# Patient Record
Sex: Female | Born: 1955
Health system: Southern US, Community
[De-identification: ages and names within clinical notes are randomized; demographics above are authoritative.]

## PROBLEM LIST (undated history)

## (undated) DIAGNOSIS — I10 Essential (primary) hypertension: Secondary | ICD-10-CM

## (undated) DIAGNOSIS — E7800 Pure hypercholesterolemia, unspecified: Secondary | ICD-10-CM

## (undated) DIAGNOSIS — E119 Type 2 diabetes mellitus without complications: Secondary | ICD-10-CM

---

## 2018-12-15 DIAGNOSIS — I10 Essential (primary) hypertension: Secondary | ICD-10-CM | POA: Diagnosis not present

## 2018-12-15 DIAGNOSIS — E119 Type 2 diabetes mellitus without complications: Secondary | ICD-10-CM | POA: Diagnosis not present

## 2019-03-26 DIAGNOSIS — E669 Obesity, unspecified: Secondary | ICD-10-CM | POA: Diagnosis not present

## 2019-03-26 DIAGNOSIS — I10 Essential (primary) hypertension: Secondary | ICD-10-CM | POA: Diagnosis not present

## 2019-03-26 DIAGNOSIS — E785 Hyperlipidemia, unspecified: Secondary | ICD-10-CM | POA: Diagnosis not present

## 2019-03-26 DIAGNOSIS — E119 Type 2 diabetes mellitus without complications: Secondary | ICD-10-CM | POA: Diagnosis not present

## 2019-03-31 ENCOUNTER — Encounter (INDEPENDENT_AMBULATORY_CARE_PROVIDER_SITE_OTHER): Payer: Self-pay | Admitting: *Deleted

## 2019-05-20 DIAGNOSIS — Z1231 Encounter for screening mammogram for malignant neoplasm of breast: Secondary | ICD-10-CM | POA: Diagnosis not present

## 2019-06-25 DIAGNOSIS — I10 Essential (primary) hypertension: Secondary | ICD-10-CM | POA: Diagnosis not present

## 2019-06-25 DIAGNOSIS — E119 Type 2 diabetes mellitus without complications: Secondary | ICD-10-CM | POA: Diagnosis not present

## 2019-06-25 DIAGNOSIS — E785 Hyperlipidemia, unspecified: Secondary | ICD-10-CM | POA: Diagnosis not present

## 2019-06-25 DIAGNOSIS — E669 Obesity, unspecified: Secondary | ICD-10-CM | POA: Diagnosis not present

## 2019-07-07 DIAGNOSIS — Z1211 Encounter for screening for malignant neoplasm of colon: Secondary | ICD-10-CM | POA: Diagnosis not present

## 2019-07-07 DIAGNOSIS — Z6836 Body mass index (BMI) 36.0-36.9, adult: Secondary | ICD-10-CM | POA: Diagnosis not present

## 2019-07-15 DIAGNOSIS — Z01812 Encounter for preprocedural laboratory examination: Secondary | ICD-10-CM | POA: Diagnosis not present

## 2019-07-15 DIAGNOSIS — Z20828 Contact with and (suspected) exposure to other viral communicable diseases: Secondary | ICD-10-CM | POA: Diagnosis not present

## 2019-07-17 DIAGNOSIS — D175 Benign lipomatous neoplasm of intra-abdominal organs: Secondary | ICD-10-CM | POA: Diagnosis not present

## 2019-07-17 DIAGNOSIS — E119 Type 2 diabetes mellitus without complications: Secondary | ICD-10-CM | POA: Diagnosis not present

## 2019-07-17 DIAGNOSIS — K648 Other hemorrhoids: Secondary | ICD-10-CM | POA: Diagnosis not present

## 2019-07-17 DIAGNOSIS — K635 Polyp of colon: Secondary | ICD-10-CM | POA: Diagnosis not present

## 2019-07-17 DIAGNOSIS — Z7984 Long term (current) use of oral hypoglycemic drugs: Secondary | ICD-10-CM | POA: Diagnosis not present

## 2019-07-17 DIAGNOSIS — D122 Benign neoplasm of ascending colon: Secondary | ICD-10-CM | POA: Diagnosis not present

## 2019-07-17 DIAGNOSIS — E785 Hyperlipidemia, unspecified: Secondary | ICD-10-CM | POA: Diagnosis not present

## 2019-07-17 DIAGNOSIS — I10 Essential (primary) hypertension: Secondary | ICD-10-CM | POA: Diagnosis not present

## 2019-07-17 DIAGNOSIS — Z1211 Encounter for screening for malignant neoplasm of colon: Secondary | ICD-10-CM | POA: Diagnosis not present

## 2019-07-17 DIAGNOSIS — D1779 Benign lipomatous neoplasm of other sites: Secondary | ICD-10-CM | POA: Diagnosis not present

## 2019-07-17 DIAGNOSIS — Z79899 Other long term (current) drug therapy: Secondary | ICD-10-CM | POA: Diagnosis not present

## 2019-08-04 DIAGNOSIS — Z1211 Encounter for screening for malignant neoplasm of colon: Secondary | ICD-10-CM | POA: Diagnosis not present

## 2019-09-28 DIAGNOSIS — R69 Illness, unspecified: Secondary | ICD-10-CM | POA: Diagnosis not present

## 2019-09-28 DIAGNOSIS — E785 Hyperlipidemia, unspecified: Secondary | ICD-10-CM | POA: Diagnosis not present

## 2019-09-28 DIAGNOSIS — E119 Type 2 diabetes mellitus without complications: Secondary | ICD-10-CM | POA: Diagnosis not present

## 2019-09-28 DIAGNOSIS — E782 Mixed hyperlipidemia: Secondary | ICD-10-CM | POA: Diagnosis not present

## 2019-09-28 DIAGNOSIS — I10 Essential (primary) hypertension: Secondary | ICD-10-CM | POA: Diagnosis not present

## 2019-09-28 DIAGNOSIS — Z6834 Body mass index (BMI) 34.0-34.9, adult: Secondary | ICD-10-CM | POA: Diagnosis not present

## 2019-12-31 DIAGNOSIS — E119 Type 2 diabetes mellitus without complications: Secondary | ICD-10-CM | POA: Diagnosis not present

## 2019-12-31 DIAGNOSIS — Z0000 Encounter for general adult medical examination without abnormal findings: Secondary | ICD-10-CM | POA: Diagnosis not present

## 2019-12-31 DIAGNOSIS — E782 Mixed hyperlipidemia: Secondary | ICD-10-CM | POA: Diagnosis not present

## 2019-12-31 DIAGNOSIS — R69 Illness, unspecified: Secondary | ICD-10-CM | POA: Diagnosis not present

## 2019-12-31 DIAGNOSIS — Z6834 Body mass index (BMI) 34.0-34.9, adult: Secondary | ICD-10-CM | POA: Diagnosis not present

## 2019-12-31 DIAGNOSIS — I10 Essential (primary) hypertension: Secondary | ICD-10-CM | POA: Diagnosis not present

## 2020-02-22 ENCOUNTER — Encounter: Payer: Self-pay | Admitting: Emergency Medicine

## 2020-02-22 ENCOUNTER — Other Ambulatory Visit: Payer: Self-pay

## 2020-02-22 ENCOUNTER — Ambulatory Visit
Admission: EM | Admit: 2020-02-22 | Discharge: 2020-02-22 | Disposition: A | Discharge status: Home / Self Care | Payer: Medicare HMO | Attending: Family Medicine | Admitting: Family Medicine

## 2020-02-22 DIAGNOSIS — Z79899 Other long term (current) drug therapy: Secondary | ICD-10-CM | POA: Diagnosis not present

## 2020-02-22 DIAGNOSIS — N898 Other specified noninflammatory disorders of vagina: Secondary | ICD-10-CM | POA: Diagnosis present

## 2020-02-22 DIAGNOSIS — Z7984 Long term (current) use of oral hypoglycemic drugs: Secondary | ICD-10-CM | POA: Diagnosis not present

## 2020-02-22 DIAGNOSIS — E7800 Pure hypercholesterolemia, unspecified: Secondary | ICD-10-CM | POA: Diagnosis not present

## 2020-02-22 DIAGNOSIS — L299 Pruritus, unspecified: Secondary | ICD-10-CM | POA: Diagnosis present

## 2020-02-22 DIAGNOSIS — E119 Type 2 diabetes mellitus without complications: Secondary | ICD-10-CM | POA: Diagnosis not present

## 2020-02-22 DIAGNOSIS — I10 Essential (primary) hypertension: Secondary | ICD-10-CM | POA: Diagnosis not present

## 2020-02-22 HISTORY — DX: Essential (primary) hypertension: I10

## 2020-02-22 HISTORY — DX: Type 2 diabetes mellitus without complications: E11.9

## 2020-02-22 HISTORY — DX: Pure hypercholesterolemia, unspecified: E78.00

## 2020-02-22 LAB — POCT URINALYSIS DIP (MANUAL ENTRY)
Bilirubin, UA: NEGATIVE
Blood, UA: NEGATIVE
Glucose, UA: NEGATIVE mg/dL
Ketones, POC UA: NEGATIVE mg/dL
Leukocytes, UA: NEGATIVE
Nitrite, UA: NEGATIVE
Protein Ur, POC: NEGATIVE mg/dL
Spec Grav, UA: >=1.03 — AB (ref 1.010–1.025)
Urobilinogen, UA: 0.2 E.U./dL
pH, UA: 5.5 (ref 5.0–8.0)

## 2020-02-22 MED ORDER — NYSTATIN 100000 UNIT/GM EX POWD: 1.0000 "application " | Freq: Three times a day (TID) | CUTANEOUS | 0 refills | Status: AC

## 2020-02-22 MED ORDER — HYDROXYZINE HCL 25 MG PO TABS: 25.0000 mg | ORAL_TABLET | Freq: Four times a day (QID) | ORAL | 0 refills | Status: AC | PRN

## 2020-02-22 MED ORDER — FLUCONAZOLE 150 MG PO TABS: 150.0000 mg | ORAL_TABLET | Freq: Once | ORAL | 0 refills | Status: AC

## 2020-02-22 MED ORDER — HYDROXYZINE HCL 25 MG PO TABS: 25.0000 mg | ORAL_TABLET | Freq: Four times a day (QID) | ORAL | 0 refills | Status: DC

## 2020-02-22 NOTE — ED Triage Notes (Signed)
Pt here for itching to back and stomach x 3 days; pt unsure if she has a rash; pt sts also has vaginal discharge

## 2020-02-22 NOTE — Discharge Instructions (Addendum)
Urine did not show signs of infection, Vaginal swab pending for discharge Begin 1 tab of diflucan today, repeat in 3-4 days if still having symptoms to treat for yeast infection Use nystatin powder to lower abdominal fold 2-3 times daily  Please try hydroxyzine every 4- 6 hours as needed for itching- may cause drowsiness, use at home or before bed- do not drive/work after taking  If symptoms continue please follow up with primary care

## 2020-02-22 NOTE — ED Provider Notes (Signed)
[facility]    CSN: [id number] Arrival date & time: 02/22/20  0955      History   Chief Complaint Chief Complaint  Patient presents with  . Pruritis  . Vaginal Discharge    HPI Melinda Schwartz is a 64 y.o. female history of hypertension, DM type II, hyperlipidemia, presenting today for evaluation of generalized itching and vaginal discharge.  Patient states that over the past 3 weeks she has had generalized itching especially around her abdomen.  She feels as if this itching "internal". Feels skin raised in certain areas. She has also had some vaginal discharge, occasionally clear, occasionally yellow. Denies significant genital itching with this, mainly to her lower abdomen. Reports occasional yeast infections with Diabetes. Denies significant history of UTI's. Denies any new partners or being sexually active. Denies abdominal pain other than itching. Denies nausea or vomiting. Denies new hygiene, lotions, soaps, detergents of recently.   HPI  Past Medical History:  Diagnosis Date  . Diabetes mellitus without complication ([place])   . Hypercholesteremia   . Hypertension     There are no problems to display for this patient.   Past Surgical History:  Procedure Laterality Date  . CESAREAN SECTION      OB History   No obstetric history on file.      Home Medications    Prior to Admission medications   Medication Sig Start Date End Date Taking? Authorizing Provider  amLODipine (NORVASC) 10 MG tablet Take 10 mg by mouth daily.   Yes [provider]  atorvastatin (LIPITOR) 40 MG tablet Take 40 mg by mouth daily.   Yes [provider]  losartan (COZAAR) 50 MG tablet Take 50 mg by mouth daily.   Yes [provider]  metFORMIN (GLUCOPHAGE) 500 MG tablet Take 500 mg by mouth 2 (two) times daily with a meal.   Yes [provider]  fluconazole (DIFLUCAN) 150 MG tablet Take 1 tablet (150 mg total) by mouth once for 1 dose. 02/22/20  02/22/20  Keven Soucy C, PA-C  hydrOXYzine (ATARAX/VISTARIL) 25 MG tablet Take 1 tablet (25 mg total) by mouth every 6 (six) hours as needed for itching. 02/22/20   Siriah Treat C, PA-C  nystatin (MYCOSTATIN/NYSTOP) powder Apply 1 application topically 3 (three) times daily. To lower abdomen 02/22/20   Nao Linz, Elesa Hacker, PA-C    Family History History reviewed. No pertinent family history.  Social History Social History   Tobacco Use  . Smoking status: Never Smoker  . Smokeless tobacco: Never Used  Substance Use Topics  . Alcohol use: Not Currently  . Drug use: Never     Allergies   Patient has no known allergies.   Review of Systems Review of Systems  Constitutional: Negative for fatigue and fever.  HENT: Negative for mouth sores.   Eyes: Negative for visual disturbance.  Respiratory: Negative for shortness of breath.   Cardiovascular: Negative for chest pain.  Gastrointestinal: Negative for abdominal pain, diarrhea, nausea and vomiting.  Genitourinary: Positive for dysuria and vaginal discharge. Negative for flank pain, genital sores, hematuria, menstrual problem, vaginal bleeding and vaginal pain.  Musculoskeletal: Negative for arthralgias, back pain and joint swelling.  Skin: Positive for rash. Negative for color change and wound.  Neurological: Negative for dizziness, weakness, light-headedness and headaches.     Physical Exam Triage Vital Signs ED Triage Vitals  Enc Vitals Group     BP      Pulse      Resp  Temp      Temp src      SpO2      Weight      Height      Head Circumference      Peak Flow      Pain Score      Pain Loc      Pain Edu?      Excl. in [place]?    No data found.  Updated Vital Signs BP (!) 160/81 (BP Location: Right Arm)   Pulse 73   Temp 98.2 F (36.8 C) (Oral)   Resp 18   SpO2 96%   Visual Acuity Right Eye Distance:   Left Eye Distance:   Bilateral Distance:    Right Eye Near:   Left Eye Near:    Bilateral  Near:     Physical Exam Vitals and nursing note reviewed.  Constitutional:      Appearance: She is well-developed.     Comments: No acute distress  HENT:     Head: Normocephalic and atraumatic.     Nose: Nose normal.  Eyes:     Conjunctiva/sclera: Conjunctivae normal.  Cardiovascular:     Rate and Rhythm: Normal rate.  Pulmonary:     Effort: Pulmonary effort is normal. No respiratory distress.     Comments: Breathing comfortably at rest, CTABL, no wheezing, rales or other adventitious sounds auscultated Abdominal:     General: There is no distension.  Genitourinary:    Comments: Normal external genitalia, no rash or lesions noted, vaginal mucosa pink, small amount of white thicker discharge, no blood noted Musculoskeletal:        General: Normal range of motion.     Cervical back: Neck supple.  Skin:    General: Skin is warm and dry.     Comments: No obvious rash noted to abdomen, back, extremities, various areas of excoriation especially to upper back- small associated scabbing and mild hyperpigmentation noted in these areas, no erythema No lesions noted to hands/webbing   Lower abdominal with slight erythema and white discoloration  Neurological:     Mental Status: She is alert and oriented to person, place, and time.      UC Treatments / Results  Labs (all labs ordered are listed, but only abnormal results are displayed) Labs Reviewed  POCT URINALYSIS DIP (MANUAL ENTRY) - Abnormal; Notable for the following components:      Result Value   Spec Grav, UA >=1.030 (*)    All other components within normal limits  CERVICOVAGINAL ANCILLARY ONLY    EKG   Radiology No results found.  Procedures Procedures (including critical care time)  Medications Ordered in UC Medications - No data to display  Initial Impression / Assessment and Plan / UC Course  I have reviewed the triage vital signs and the nursing notes.  Pertinent labs & imaging results that were  available during my care of the patient were reviewed by me and considered in my medical decision making (see chart for details).     UA negative for leuks and nitrites.  Vaginal swab pending. Vaginal discharge most consistent with yeast, will treat for yeast with Diflucan. Topical nystatin for yeast in lower abdominal fold. Unclear cause of generalized itching by patient, continue hydration measures, try hydroxyzine for itching. Follow up with primary.   Discussed strict return precautions. Patient verbalized understanding and is agreeable with plan.  Final Clinical Impressions(s) / UC Diagnoses   Final diagnoses:  Pruritic dermatitis  Vaginal discharge  Discharge Instructions     Urine did not show signs of infection, Vaginal swab pending for discharge Begin 1 tab of diflucan today, repeat in 3-4 days if still having symptoms to treat for yeast infection Use nystatin powder to lower abdominal fold 2-3 times daily  Please try hydroxyzine every 4- 6 hours as needed for itching- may cause drowsiness, use at home or before bed- do not drive/work after taking  If symptoms continue please follow up with primary care    ED Prescriptions    Medication Sig Dispense Auth. Provider   fluconazole (DIFLUCAN) 150 MG tablet Take 1 tablet (150 mg total) by mouth once for 1 dose. 2 tablet Baylee Mccorkel C, PA-C   nystatin (MYCOSTATIN/NYSTOP) powder Apply 1 application topically 3 (three) times daily. To lower abdomen 15 g Maryjo Ragon C, PA-C   hydrOXYzine (ATARAX/VISTARIL) 25 MG tablet  (Status: Discontinued) Take 1 tablet (25 mg total) by mouth every 6 (six) hours. 20 tablet Arrie Zuercher C, PA-C   hydrOXYzine (ATARAX/VISTARIL) 25 MG tablet Take 1 tablet (25 mg total) by mouth every 6 (six) hours as needed for itching. 20 tablet Kirk Basquez, [place] C, PA-C     PDMP not reviewed this encounter.   Janith Lima, PA-C 02/22/20 1125

## 2020-02-23 LAB — CERVICOVAGINAL ANCILLARY ONLY
Bacterial vaginitis: NEGATIVE
Candida vaginitis: NEGATIVE

## 2020-03-02 DIAGNOSIS — I63511 Cerebral infarction due to unspecified occlusion or stenosis of right middle cerebral artery: Secondary | ICD-10-CM | POA: Diagnosis not present

## 2020-03-02 DIAGNOSIS — R69 Illness, unspecified: Secondary | ICD-10-CM | POA: Diagnosis not present

## 2020-03-02 DIAGNOSIS — M79605 Pain in left leg: Secondary | ICD-10-CM | POA: Diagnosis not present

## 2020-03-31 DIAGNOSIS — E119 Type 2 diabetes mellitus without complications: Secondary | ICD-10-CM | POA: Diagnosis not present

## 2020-03-31 DIAGNOSIS — L299 Pruritus, unspecified: Secondary | ICD-10-CM | POA: Diagnosis not present

## 2020-03-31 DIAGNOSIS — R69 Illness, unspecified: Secondary | ICD-10-CM | POA: Diagnosis not present

## 2020-03-31 DIAGNOSIS — E782 Mixed hyperlipidemia: Secondary | ICD-10-CM | POA: Diagnosis not present

## 2020-03-31 DIAGNOSIS — I10 Essential (primary) hypertension: Secondary | ICD-10-CM | POA: Diagnosis not present

## 2020-03-31 DIAGNOSIS — Z6834 Body mass index (BMI) 34.0-34.9, adult: Secondary | ICD-10-CM | POA: Diagnosis not present

## 2020-05-13 ENCOUNTER — Emergency Department (HOSPITAL_COMMUNITY): Payer: Medicare HMO

## 2020-05-13 ENCOUNTER — Encounter: Payer: Self-pay | Admitting: Emergency Medicine

## 2020-05-13 ENCOUNTER — Encounter (HOSPITAL_COMMUNITY): Payer: Self-pay | Admitting: *Deleted

## 2020-05-13 ENCOUNTER — Ambulatory Visit
Admission: EM | Admit: 2020-05-13 | Discharge: 2020-05-13 | Disposition: A | Discharge status: Home / Self Care | Payer: Medicare HMO | Source: Home / Self Care

## 2020-05-13 ENCOUNTER — Emergency Department (HOSPITAL_COMMUNITY)
Admission: EM | Admit: 2020-05-13 | Discharge: 2020-05-13 | Disposition: A | Discharge status: Home / Self Care | Payer: Medicare HMO | Attending: Emergency Medicine | Admitting: Emergency Medicine

## 2020-05-13 ENCOUNTER — Other Ambulatory Visit: Payer: Self-pay

## 2020-05-13 DIAGNOSIS — R1110 Vomiting, unspecified: Secondary | ICD-10-CM | POA: Diagnosis not present

## 2020-05-13 DIAGNOSIS — N939 Abnormal uterine and vaginal bleeding, unspecified: Secondary | ICD-10-CM | POA: Diagnosis not present

## 2020-05-13 DIAGNOSIS — E119 Type 2 diabetes mellitus without complications: Secondary | ICD-10-CM | POA: Diagnosis not present

## 2020-05-13 DIAGNOSIS — R197 Diarrhea, unspecified: Secondary | ICD-10-CM | POA: Insufficient documentation

## 2020-05-13 DIAGNOSIS — R1084 Generalized abdominal pain: Secondary | ICD-10-CM

## 2020-05-13 DIAGNOSIS — R112 Nausea with vomiting, unspecified: Secondary | ICD-10-CM

## 2020-05-13 DIAGNOSIS — Z7984 Long term (current) use of oral hypoglycemic drugs: Secondary | ICD-10-CM | POA: Insufficient documentation

## 2020-05-13 DIAGNOSIS — Z79899 Other long term (current) drug therapy: Secondary | ICD-10-CM | POA: Diagnosis not present

## 2020-05-13 DIAGNOSIS — N950 Postmenopausal bleeding: Secondary | ICD-10-CM | POA: Diagnosis not present

## 2020-05-13 DIAGNOSIS — I10 Essential (primary) hypertension: Secondary | ICD-10-CM | POA: Insufficient documentation

## 2020-05-13 LAB — URINALYSIS, ROUTINE W REFLEX MICROSCOPIC
Bacteria, UA: NONE SEEN
Bilirubin Urine: NEGATIVE
Glucose, UA: NEGATIVE mg/dL
Hgb urine dipstick: NEGATIVE
Ketones, ur: 5 mg/dL — AB
Leukocytes,Ua: NEGATIVE
Nitrite: NEGATIVE
Protein, ur: 30 mg/dL — AB
Specific Gravity, Urine: 1.021 (ref 1.005–1.030)
pH: 6 (ref 5.0–8.0)

## 2020-05-13 LAB — CBC
HCT: 43.5 % (ref 36.0–46.0)
Hemoglobin: 13.5 g/dL (ref 12.0–15.0)
MCH: 26.6 pg (ref 26.0–34.0)
MCHC: 31 g/dL (ref 30.0–36.0)
MCV: 85.6 fL (ref 80.0–100.0)
Platelets: 285 10*3/uL (ref 150–400)
RBC: 5.08 MIL/uL (ref 3.87–5.11)
RDW: 13.1 % (ref 11.5–15.5)
WBC: 8.2 10*3/uL (ref 4.0–10.5)
nRBC: 0 % (ref 0.0–0.2)

## 2020-05-13 LAB — COMPREHENSIVE METABOLIC PANEL
ALT: 52 U/L — ABNORMAL HIGH (ref 0–44)
AST: 34 U/L (ref 15–41)
Albumin: 4.8 g/dL (ref 3.5–5.0)
Alkaline Phosphatase: 68 U/L (ref 38–126)
Anion gap: 14 (ref 5–15)
BUN: 15 mg/dL (ref 8–23)
CO2: 26 mmol/L (ref 22–32)
Calcium: 10.1 mg/dL (ref 8.9–10.3)
Chloride: 95 mmol/L — ABNORMAL LOW (ref 98–111)
Creatinine, Ser: 1 mg/dL (ref 0.44–1.00)
GFR calc Af Amer: >60 mL/min (ref >60)
GFR calc non Af Amer: 59 mL/min — ABNORMAL LOW (ref >60)
Glucose, Bld: 179 mg/dL — ABNORMAL HIGH (ref 70–99)
Potassium: 4.2 mmol/L (ref 3.5–5.1)
Sodium: 135 mmol/L (ref 135–145)
Total Bilirubin: 0.6 mg/dL (ref 0.3–1.2)
Total Protein: 8.9 g/dL — ABNORMAL HIGH (ref 6.5–8.1)

## 2020-05-13 LAB — LIPASE, BLOOD: Lipase: 24 U/L (ref 11–51)

## 2020-05-13 MED ORDER — ONDANSETRON 4 MG PO TBDP: 4.0000 mg | ORAL_TABLET | Freq: Three times a day (TID) | ORAL | 0 refills | Status: AC | PRN

## 2020-05-13 MED ORDER — IOHEXOL 300 MG/ML  SOLN
100.0000 mL | Freq: Once | INTRAMUSCULAR | Status: AC | PRN
  Administered 2020-05-13: 100 mL via INTRAVENOUS

## 2020-05-13 NOTE — Discharge Instructions (Signed)
Patient was advised to go to ED for further evaluation 

## 2020-05-13 NOTE — Discharge Instructions (Signed)
See your Physician for recheck next week.  Your Physician will need to review your Ct and schedule a follow up ultrasound

## 2020-05-13 NOTE — ED Triage Notes (Signed)
Abdominal pain with nausea 

## 2020-05-13 NOTE — ED Provider Notes (Signed)
[place]   [id number] 05/13/20 Arrival Time: 8756  CC: ABDOMINAL DISCOMFORT  SUBJECTIVE:  Melinda Schwartz is a 64 y.o. female who presents to the urgent care with a  complaint of nausea, vomiting, diarrhea, and abdominal pain  for the past 2 days.  Reported vaginal bleeding started last night.  Denies a precipitating event, trauma, close contacts with similar symptoms, recent travel or antibiotic use.  Localizes pain to generalized abdomen.  Describes it as intermittent, constant and achy.  Has tried OTC Imodium without relief.  Denies alleviating or aggravating factors.  Denies similar symptoms in the past.  Last BM 05/13/20.  Denies fever, chills, appetite changes, weight changes, chest pain, SOB, constipation, hematochezia, melena, dysuria, difficulty urinating, increased frequency or urgency, flank pain, loss of bowel or bladder function, vaginal discharge, vaginal odor, dyspareunia, pelvic pain.     No LMP recorded. Patient is postmenopausal.  ROS: As per HPI.  All other pertinent ROS negative.     Past Medical History:  Diagnosis Date  . Diabetes mellitus without complication ([place])   . Hypercholesteremia   . Hypertension    Past Surgical History:  Procedure Laterality Date  . CESAREAN SECTION     No Known Allergies No current facility-administered medications on file prior to encounter.   Current Outpatient Medications on File Prior to Encounter  Medication Sig Dispense Refill  . amLODipine (NORVASC) 10 MG tablet Take 10 mg by mouth daily.    Marland Kitchen atorvastatin (LIPITOR) 40 MG tablet Take 40 mg by mouth daily.    . hydrOXYzine (ATARAX/VISTARIL) 25 MG tablet Take 1 tablet (25 mg total) by mouth every 6 (six) hours as needed for itching. 20 tablet 0  . losartan (COZAAR) 50 MG tablet Take 50 mg by mouth daily.    . metFORMIN (GLUCOPHAGE) 500 MG tablet Take 500 mg by mouth 2 (two) times daily with a meal.    . nystatin (MYCOSTATIN/NYSTOP) powder Apply 1 application  topically 3 (three) times daily. To lower abdomen 15 g 0   Social History   Socioeconomic History  . Marital status: Single    Spouse name: Not on file  . Number of children: Not on file  . Years of education: Not on file  . Highest education level: Not on file  Occupational History  . Not on file  Tobacco Use  . Smoking status: Never Smoker  . Smokeless tobacco: Never Used  Substance and Sexual Activity  . Alcohol use: Not Currently  . Drug use: Never  . Sexual activity: Not on file  Other Topics Concern  . Not on file  Social History Narrative  . Not on file   Social Determinants of Health   Financial Resource Strain:   . Difficulty of Paying Living Expenses:   Food Insecurity:   . Worried About Charity fundraiser in the Last Year:   . Arboriculturist in the Last Year:   Transportation Needs:   . Film/video editor (Medical):   Marland Kitchen Lack of Transportation (Non-Medical):   Physical Activity:   . Days of Exercise per Week:   . Minutes of Exercise per Session:   Stress:   . Feeling of Stress :   Social Connections:   . Frequency of Communication with Friends and Family:   . Frequency of Social Gatherings with Friends and Family:   . Attends Religious Services:   . Active Member of Clubs or Organizations:   . Attends Archivist Meetings:   .  Marital Status:   Intimate Partner Violence:   . Fear of Current or Ex-Partner:   . Emotionally Abused:   Marland Kitchen Physically Abused:   . Sexually Abused:    No family history on file.   OBJECTIVE:  Vitals:   05/13/20 1132 05/13/20 1134  BP: (!) 152/88   Pulse: 83   Resp: 18   Temp: 99 F (37.2 C)   TempSrc: Oral   SpO2: 96%   Weight:  220 lb (99.8 kg)  Height:  5\' 7"  (1.702 m)    Physical Exam Vitals and nursing note reviewed.  Constitutional:      General: She is not in acute distress.    Appearance: Normal appearance. She is normal weight. She is not ill-appearing, toxic-appearing or diaphoretic.    Cardiovascular:     Rate and Rhythm: Normal rate and regular rhythm.     Pulses: Normal pulses.     Heart sounds: Normal heart sounds. No murmur. No gallop.   Pulmonary:     Effort: Pulmonary effort is normal. No respiratory distress.     Breath sounds: Normal breath sounds. No stridor. No wheezing, rhonchi or rales.  Chest:     Chest wall: No tenderness.  Abdominal:     General: Abdomen is flat. Bowel sounds are normal. There is no distension.     Palpations: Abdomen is soft. There is no mass.     Tenderness: There is generalized abdominal tenderness. There is no right CVA tenderness, left CVA tenderness, guarding or rebound.     Hernia: No hernia is present.  Neurological:     Mental Status: She is alert.    LABS: No results found for this or any previous visit (from the past 24 hour(s)).  DIAGNOSTIC STUDIES: No results found.   ASSESSMENT & PLAN:  1. Generalized abdominal pain   2. Abnormal vaginal bleeding   3. Nausea vomiting and diarrhea    Patient was seen at the urgent care.  There is a concern of other abdominal disease process that need to be ruled out.  Patient was advised to go to ED for further evaluation.  No orders of the defined types were placed in this encounter.    Discharge instructions  Patient was advised to go to ED for further evaluation  Reviewed expectations re: course of current medical issues. Questions answered. Outlined signs and symptoms indicating need for more acute intervention. Patient verbalized understanding. After Visit Summary given.   Emerson Monte, [place] 05/13/20 1824

## 2020-05-13 NOTE — ED Triage Notes (Addendum)
N/v/d x 3-4 days.  Vomited x 4 over the last 24 hours. abd pain all over abd and feels weak.  Denies any burning with urination.

## 2020-05-15 NOTE — ED Provider Notes (Signed)
[facility] EMERGENCY DEPARTMENT Provider Note   CSN: [id number] Arrival date & time: 05/13/20  1258     History Chief Complaint  Patient presents with  . Abdominal Pain    Melinda Schwartz is a 64 y.o. female.  The history is provided by the patient. No language interpreter was used.  Abdominal Pain Pain location:  Generalized Pain quality: aching   Pain radiates to:  Does not radiate Pain severity:  Moderate Onset quality:  Gradual Duration:  1 week Timing:  Constant Progression:  Worsening Chronicity:  New Relieved by:  Nothing Worsened by:  Nothing Ineffective treatments:  None tried Associated symptoms: diarrhea, nausea and vomiting   Risk factors: no alcohol abuse        Past Medical History:  Diagnosis Date  . Diabetes mellitus without complication ([place])   . Hypercholesteremia   . Hypertension     There are no problems to display for this patient.   Past Surgical History:  Procedure Laterality Date  . CESAREAN SECTION       OB History   No obstetric history on file.     History reviewed. No pertinent family history.  Social History   Tobacco Use  . Smoking status: Never Smoker  . Smokeless tobacco: Never Used  Substance Use Topics  . Alcohol use: Not Currently  . Drug use: Never    Home Medications Prior to Admission medications   Medication Sig Start Date End Date Taking? Authorizing Provider  amLODipine (NORVASC) 10 MG tablet Take 10 mg by mouth daily.    [provider]  atorvastatin (LIPITOR) 40 MG tablet Take 40 mg by mouth daily.    [provider]  hydrOXYzine (ATARAX/VISTARIL) 25 MG tablet Take 1 tablet (25 mg total) by mouth every 6 (six) hours as needed for itching. 02/22/20   Wieters, Hallie C, PA-C  losartan (COZAAR) 50 MG tablet Take 50 mg by mouth daily.    [provider]  metFORMIN (GLUCOPHAGE) 500 MG tablet Take 500 mg by mouth 2 (two) times daily with a meal.    [provider]  nystatin  (MYCOSTATIN/NYSTOP) powder Apply 1 application topically 3 (three) times daily. To lower abdomen 02/22/20   Wieters, Hallie C, PA-C  ondansetron (ZOFRAN ODT) 4 MG disintegrating tablet Take 1 tablet (4 mg total) by mouth every 8 (eight) hours as needed for nausea or vomiting. 05/13/20   Fransico Meadow, PA-C    Allergies    Patient has no known allergies.  Review of Systems   Review of Systems  Gastrointestinal: Positive for abdominal pain, diarrhea, nausea and vomiting.  All other systems reviewed and are negative.   Physical Exam Updated Vital Signs BP (!) 151/88 (BP Location: Right Arm)   Pulse 84   Temp 98.9 F (37.2 C) (Oral)   Resp 18   Ht 5\' 7"  (1.702 m)   Wt 99.8 kg   SpO2 99%   BMI 34.46 kg/m   Physical Exam Vitals and nursing note reviewed.  Constitutional:      Appearance: She is well-developed.  HENT:     Head: Normocephalic.  Eyes:     Extraocular Movements: Extraocular movements intact.  Cardiovascular:     Rate and Rhythm: Normal rate.  Pulmonary:     Effort: Pulmonary effort is normal.  Abdominal:     General: Abdomen is flat. Bowel sounds are increased. There is no distension.     Palpations: Abdomen is soft.  Musculoskeletal:  General: Normal range of motion.     Cervical back: Normal range of motion.  Skin:    General: Skin is warm.  Neurological:     Mental Status: She is alert and oriented to person, place, and time.     ED Results / Procedures / Treatments   Labs (all labs ordered are listed, but only abnormal results are displayed) Labs Reviewed  COMPREHENSIVE METABOLIC PANEL - Abnormal; Notable for the following components:      Result Value   Chloride 95 (*)    Glucose, Bld 179 (*)    Total Protein 8.9 (*)    ALT 52 (*)    GFR calc non Af Amer 59 (*)    All other components within normal limits  URINALYSIS, ROUTINE W REFLEX MICROSCOPIC - Abnormal; Notable for the following components:   Ketones, ur 5 (*)    Protein, ur 30  (*)    All other components within normal limits  LIPASE, BLOOD  CBC    EKG EKG Interpretation  Date/Time:  Friday May 13 2020 13:39:19 EDT Ventricular Rate:  81 PR Interval:  170 QRS Duration: 90 QT Interval:  360 QTC Calculation: 418 R Axis:   -41 Text Interpretation: Normal sinus rhythm Left axis deviation Left ventricular hypertrophy No previous tracing Confirmed by Lajean Saver [phone]) on 05/13/2020 1:50:32 PM   Radiology CT ABDOMEN PELVIS W CONTRAST  Result Date: 05/13/2020 CLINICAL DATA:  Nausea and vomiting EXAM: CT ABDOMEN AND PELVIS WITH CONTRAST TECHNIQUE: Multidetector CT imaging of the abdomen and pelvis was performed using the standard protocol following bolus administration of intravenous contrast. CONTRAST:  160mL OMNIPAQUE IOHEXOL 300 MG/ML  SOLN COMPARISON:  None. FINDINGS: Lower chest: Lung bases demonstrate no acute consolidation or effusion. Hepatobiliary: Hepatic steatosis. No calcified gallstone or biliary dilatation. Pancreas: Unremarkable. No pancreatic ductal dilatation or surrounding inflammatory changes. Spleen: Normal in size without focal abnormality. Adrenals/Urinary Tract: Right adrenal gland is normal. 1.8 cm left adrenal gland nodule. Kidneys show no hydronephrosis. The bladder is normal Stomach/Bowel: The stomach is nonenlarged. Mucosal enhancement, mild indistinct wall thickening and surrounding inflammatory change at the duodenal bulb, best seen on coronal images, series 6, image number 43. No dilated small bowel. Negative appendix. Vascular/Lymphatic: Mild aortic atherosclerosis without aneurysm. No suspicious nodes Reproductive: Abnormal endometrial thickening, measures up to 2.1 cm on sagittal images. Calcified fundal masses likely fibroids. No adnexal mass. Other: Negative for free air or free fluid Musculoskeletal: No acute or significant osseous findings. IMPRESSION: 1. Mild indistinct wall thickening and surrounding inflammatory change at the duodenal  bulb suspicious for duodenitis/peptic ulcer disease. No extraluminal gas to suggest perforation. 2. Otherwise no CT evidence for acute intra-abdominal or pelvic abnormality 3. Marked abnormal endometrial thickening for which correlation with pelvic ultrasound is advised. This may be performed on a nonemergent basis unless otherwise indicated. Electronically Signed   By: Donavan Foil M.D.   On: 05/13/2020 19:16    Procedures Procedures (including critical care time)  Medications Ordered in ED Medications  iohexol (OMNIPAQUE) 300 MG/ML solution 100 mL (100 mLs Intravenous Contrast Given 05/13/20 1843)    ED Course  I have reviewed the triage vital signs and the nursing notes.  Pertinent labs & imaging results that were available during my care of the patient were reviewed by me and considered in my medical decision making (see chart for details).    MDM Rules/Calculators/A&P  MDM: Pt advised of need to have follow up ultrasound.  Ct no diverticulitis.  Possible ulcer disease.  Pt advised to follow up with her. MD.  Pt given rx for Zofran and follow up instructions  Final Clinical Impression(s) / ED Diagnoses Final diagnoses:  Nausea vomiting and diarrhea    Rx / DC Orders ED Discharge Orders         Ordered    ondansetron (ZOFRAN ODT) 4 MG disintegrating tablet  Every 8 hours PRN     05/13/20 1926        An After Visit Summary was printed and given to the patient.    Fransico Meadow, [place] 05/15/20 1324    Milton Ferguson, MD 05/15/20 [phone]

## 2020-06-30 DIAGNOSIS — R69 Illness, unspecified: Secondary | ICD-10-CM | POA: Diagnosis not present

## 2020-06-30 DIAGNOSIS — E119 Type 2 diabetes mellitus without complications: Secondary | ICD-10-CM | POA: Diagnosis not present

## 2020-06-30 DIAGNOSIS — R1013 Epigastric pain: Secondary | ICD-10-CM | POA: Diagnosis not present

## 2020-06-30 DIAGNOSIS — L299 Pruritus, unspecified: Secondary | ICD-10-CM | POA: Diagnosis not present

## 2020-06-30 DIAGNOSIS — I10 Essential (primary) hypertension: Secondary | ICD-10-CM | POA: Diagnosis not present

## 2020-06-30 DIAGNOSIS — E782 Mixed hyperlipidemia: Secondary | ICD-10-CM | POA: Diagnosis not present

## 2020-06-30 DIAGNOSIS — Z6833 Body mass index (BMI) 33.0-33.9, adult: Secondary | ICD-10-CM | POA: Diagnosis not present

## 2020-07-05 DIAGNOSIS — R1013 Epigastric pain: Secondary | ICD-10-CM | POA: Diagnosis not present

## 2020-07-05 DIAGNOSIS — K828 Other specified diseases of gallbladder: Secondary | ICD-10-CM | POA: Diagnosis not present

## 2020-07-05 DIAGNOSIS — R197 Diarrhea, unspecified: Secondary | ICD-10-CM | POA: Diagnosis not present

## 2020-07-05 DIAGNOSIS — R188 Other ascites: Secondary | ICD-10-CM | POA: Diagnosis not present

## 2020-07-14 DIAGNOSIS — R69 Illness, unspecified: Secondary | ICD-10-CM | POA: Diagnosis not present

## 2020-07-14 DIAGNOSIS — I10 Essential (primary) hypertension: Secondary | ICD-10-CM | POA: Diagnosis not present

## 2020-07-14 DIAGNOSIS — N938 Other specified abnormal uterine and vaginal bleeding: Secondary | ICD-10-CM | POA: Diagnosis not present

## 2020-07-14 DIAGNOSIS — Z01411 Encounter for gynecological examination (general) (routine) with abnormal findings: Secondary | ICD-10-CM | POA: Diagnosis not present

## 2020-07-14 DIAGNOSIS — N950 Postmenopausal bleeding: Secondary | ICD-10-CM | POA: Diagnosis not present

## 2020-07-15 DIAGNOSIS — R87613 High grade squamous intraepithelial lesion on cytologic smear of cervix (HGSIL): Secondary | ICD-10-CM | POA: Diagnosis not present

## 2020-07-15 DIAGNOSIS — R87614 Cytologic evidence of malignancy on smear of cervix: Secondary | ICD-10-CM | POA: Diagnosis not present

## 2020-07-15 DIAGNOSIS — N950 Postmenopausal bleeding: Secondary | ICD-10-CM | POA: Diagnosis not present

## 2020-07-16 DIAGNOSIS — N8500 Endometrial hyperplasia, unspecified: Secondary | ICD-10-CM | POA: Diagnosis not present

## 2020-07-16 DIAGNOSIS — R799 Abnormal finding of blood chemistry, unspecified: Secondary | ICD-10-CM | POA: Diagnosis not present

## 2020-07-16 DIAGNOSIS — R102 Pelvic and perineal pain: Secondary | ICD-10-CM | POA: Diagnosis not present

## 2020-07-19 DIAGNOSIS — N924 Excessive bleeding in the premenopausal period: Secondary | ICD-10-CM | POA: Diagnosis not present

## 2020-08-04 DIAGNOSIS — C541 Malignant neoplasm of endometrium: Secondary | ICD-10-CM | POA: Diagnosis not present

## 2020-08-04 DIAGNOSIS — N950 Postmenopausal bleeding: Secondary | ICD-10-CM | POA: Diagnosis not present

## 2020-08-08 ENCOUNTER — Other Ambulatory Visit (HOSPITAL_COMMUNITY): Payer: Self-pay | Admitting: Obstetrics & Gynecology

## 2020-08-08 ENCOUNTER — Other Ambulatory Visit: Payer: Self-pay | Admitting: Obstetrics & Gynecology

## 2020-08-08 DIAGNOSIS — C569 Malignant neoplasm of unspecified ovary: Secondary | ICD-10-CM

## 2020-08-09 DIAGNOSIS — R102 Pelvic and perineal pain: Secondary | ICD-10-CM | POA: Diagnosis not present

## 2020-08-09 DIAGNOSIS — C541 Malignant neoplasm of endometrium: Secondary | ICD-10-CM | POA: Diagnosis not present

## 2020-08-12 ENCOUNTER — Other Ambulatory Visit: Payer: Self-pay

## 2020-08-12 ENCOUNTER — Encounter (HOSPITAL_COMMUNITY): Payer: Self-pay

## 2020-08-12 DIAGNOSIS — N938 Other specified abnormal uterine and vaginal bleeding: Secondary | ICD-10-CM | POA: Diagnosis not present

## 2020-08-12 DIAGNOSIS — R112 Nausea with vomiting, unspecified: Secondary | ICD-10-CM | POA: Diagnosis not present

## 2020-08-12 DIAGNOSIS — R1110 Vomiting, unspecified: Secondary | ICD-10-CM | POA: Diagnosis not present

## 2020-08-12 DIAGNOSIS — R109 Unspecified abdominal pain: Secondary | ICD-10-CM | POA: Diagnosis present

## 2020-08-12 DIAGNOSIS — Z5321 Procedure and treatment not carried out due to patient leaving prior to being seen by health care provider: Secondary | ICD-10-CM | POA: Diagnosis not present

## 2020-08-12 DIAGNOSIS — R1030 Lower abdominal pain, unspecified: Secondary | ICD-10-CM | POA: Insufficient documentation

## 2020-08-12 DIAGNOSIS — R197 Diarrhea, unspecified: Secondary | ICD-10-CM | POA: Diagnosis not present

## 2020-08-12 DIAGNOSIS — C541 Malignant neoplasm of endometrium: Secondary | ICD-10-CM | POA: Diagnosis not present

## 2020-08-12 LAB — LIPASE, BLOOD: Lipase: 23 U/L (ref 11–51)

## 2020-08-12 LAB — COMPREHENSIVE METABOLIC PANEL
ALT: 28 U/L (ref 0–44)
AST: 28 U/L (ref 15–41)
Albumin: 4.7 g/dL (ref 3.5–5.0)
Alkaline Phosphatase: 52 U/L (ref 38–126)
Anion gap: 13 (ref 5–15)
BUN: 19 mg/dL (ref 8–23)
CO2: 26 mmol/L (ref 22–32)
Calcium: 10.2 mg/dL (ref 8.9–10.3)
Chloride: 97 mmol/L — ABNORMAL LOW (ref 98–111)
Creatinine, Ser: 1.03 mg/dL — ABNORMAL HIGH (ref 0.44–1.00)
GFR calc Af Amer: >60 mL/min (ref >60)
GFR calc non Af Amer: 57 mL/min — ABNORMAL LOW (ref >60)
Glucose, Bld: 136 mg/dL — ABNORMAL HIGH (ref 70–99)
Potassium: 4 mmol/L (ref 3.5–5.1)
Sodium: 136 mmol/L (ref 135–145)
Total Bilirubin: 0.6 mg/dL (ref 0.3–1.2)
Total Protein: 8.7 g/dL — ABNORMAL HIGH (ref 6.5–8.1)

## 2020-08-12 LAB — CBC
HCT: 41.2 % (ref 36.0–46.0)
Hemoglobin: 12.7 g/dL (ref 12.0–15.0)
MCH: 26.9 pg (ref 26.0–34.0)
MCHC: 30.8 g/dL (ref 30.0–36.0)
MCV: 87.3 fL (ref 80.0–100.0)
Platelets: 330 10*3/uL (ref 150–400)
RBC: 4.72 MIL/uL (ref 3.87–5.11)
RDW: 12.4 % (ref 11.5–15.5)
WBC: 5.9 10*3/uL (ref 4.0–10.5)
nRBC: 0 % (ref 0.0–0.2)

## 2020-08-12 NOTE — ED Triage Notes (Signed)
Pt to er, pt states that she has endometrial cancer and is scheduled to have surgery at [facility], states that she is here for abd pain, vomiting for the past three days and some pain in her groin.

## 2020-08-13 ENCOUNTER — Emergency Department (HOSPITAL_COMMUNITY)
Admission: EM | Admit: 2020-08-13 | Discharge: 2020-08-13 | Disposition: A | Discharge status: Home / Self Care | Payer: Medicare HMO | Attending: Emergency Medicine | Admitting: Emergency Medicine

## 2020-08-13 DIAGNOSIS — R9431 Abnormal electrocardiogram [ECG] [EKG]: Secondary | ICD-10-CM | POA: Diagnosis not present

## 2020-08-13 DIAGNOSIS — N938 Other specified abnormal uterine and vaginal bleeding: Secondary | ICD-10-CM | POA: Diagnosis not present

## 2020-08-13 DIAGNOSIS — R112 Nausea with vomiting, unspecified: Secondary | ICD-10-CM | POA: Diagnosis not present

## 2020-08-13 DIAGNOSIS — R197 Diarrhea, unspecified: Secondary | ICD-10-CM | POA: Diagnosis not present

## 2020-08-16 DIAGNOSIS — R188 Other ascites: Secondary | ICD-10-CM | POA: Diagnosis not present

## 2020-08-16 DIAGNOSIS — J986 Disorders of diaphragm: Secondary | ICD-10-CM | POA: Diagnosis not present

## 2020-08-16 DIAGNOSIS — C541 Malignant neoplasm of endometrium: Secondary | ICD-10-CM | POA: Diagnosis not present

## 2020-08-16 DIAGNOSIS — C786 Secondary malignant neoplasm of retroperitoneum and peritoneum: Secondary | ICD-10-CM | POA: Diagnosis not present

## 2020-08-16 DIAGNOSIS — N950 Postmenopausal bleeding: Secondary | ICD-10-CM | POA: Diagnosis not present

## 2020-08-16 DIAGNOSIS — C569 Malignant neoplasm of unspecified ovary: Secondary | ICD-10-CM | POA: Diagnosis not present

## 2020-08-16 DIAGNOSIS — R935 Abnormal findings on diagnostic imaging of other abdominal regions, including retroperitoneum: Secondary | ICD-10-CM | POA: Diagnosis not present

## 2020-08-22 ENCOUNTER — Ambulatory Visit: Admission: RE | Admit: 2020-08-22 | Payer: Medicare HMO | Source: Ambulatory Visit

## 2020-08-25 DIAGNOSIS — C541 Malignant neoplasm of endometrium: Secondary | ICD-10-CM | POA: Diagnosis not present

## 2020-08-25 DIAGNOSIS — Z01812 Encounter for preprocedural laboratory examination: Secondary | ICD-10-CM | POA: Diagnosis not present

## 2020-08-31 DIAGNOSIS — C561 Malignant neoplasm of right ovary: Secondary | ICD-10-CM | POA: Diagnosis not present

## 2020-08-31 DIAGNOSIS — K5669 Other partial intestinal obstruction: Secondary | ICD-10-CM | POA: Diagnosis not present

## 2020-08-31 DIAGNOSIS — C785 Secondary malignant neoplasm of large intestine and rectum: Secondary | ICD-10-CM | POA: Diagnosis not present

## 2020-08-31 DIAGNOSIS — C786 Secondary malignant neoplasm of retroperitoneum and peritoneum: Secondary | ICD-10-CM | POA: Diagnosis not present

## 2020-08-31 DIAGNOSIS — C7989 Secondary malignant neoplasm of other specified sites: Secondary | ICD-10-CM | POA: Diagnosis not present

## 2020-08-31 DIAGNOSIS — C5701 Malignant neoplasm of right fallopian tube: Secondary | ICD-10-CM | POA: Diagnosis not present

## 2020-08-31 DIAGNOSIS — C541 Malignant neoplasm of endometrium: Secondary | ICD-10-CM | POA: Diagnosis not present

## 2020-08-31 DIAGNOSIS — I10 Essential (primary) hypertension: Secondary | ICD-10-CM | POA: Diagnosis not present

## 2020-08-31 DIAGNOSIS — C772 Secondary and unspecified malignant neoplasm of intra-abdominal lymph nodes: Secondary | ICD-10-CM | POA: Diagnosis not present

## 2020-08-31 DIAGNOSIS — C187 Malignant neoplasm of sigmoid colon: Secondary | ICD-10-CM | POA: Diagnosis not present

## 2020-08-31 DIAGNOSIS — C179 Malignant neoplasm of small intestine, unspecified: Secondary | ICD-10-CM | POA: Diagnosis not present

## 2020-08-31 DIAGNOSIS — E669 Obesity, unspecified: Secondary | ICD-10-CM | POA: Diagnosis not present

## 2020-08-31 DIAGNOSIS — N135 Crossing vessel and stricture of ureter without hydronephrosis: Secondary | ICD-10-CM | POA: Diagnosis not present

## 2020-08-31 DIAGNOSIS — R180 Malignant ascites: Secondary | ICD-10-CM | POA: Diagnosis not present

## 2020-08-31 DIAGNOSIS — C539 Malignant neoplasm of cervix uteri, unspecified: Secondary | ICD-10-CM | POA: Diagnosis not present

## 2020-08-31 DIAGNOSIS — C5702 Malignant neoplasm of left fallopian tube: Secondary | ICD-10-CM | POA: Diagnosis not present

## 2020-08-31 DIAGNOSIS — Z6830 Body mass index (BMI) 30.0-30.9, adult: Secondary | ICD-10-CM | POA: Diagnosis not present

## 2020-08-31 DIAGNOSIS — G8918 Other acute postprocedural pain: Secondary | ICD-10-CM | POA: Diagnosis not present

## 2020-08-31 DIAGNOSIS — Z7984 Long term (current) use of oral hypoglycemic drugs: Secondary | ICD-10-CM | POA: Diagnosis not present

## 2020-08-31 DIAGNOSIS — C481 Malignant neoplasm of specified parts of peritoneum: Secondary | ICD-10-CM | POA: Diagnosis not present

## 2020-08-31 DIAGNOSIS — E118 Type 2 diabetes mellitus with unspecified complications: Secondary | ICD-10-CM | POA: Diagnosis not present

## 2020-08-31 DIAGNOSIS — K56600 Partial intestinal obstruction, unspecified as to cause: Secondary | ICD-10-CM | POA: Diagnosis not present

## 2020-08-31 DIAGNOSIS — N950 Postmenopausal bleeding: Secondary | ICD-10-CM | POA: Diagnosis not present

## 2020-08-31 DIAGNOSIS — R69 Illness, unspecified: Secondary | ICD-10-CM | POA: Diagnosis not present

## 2020-08-31 DIAGNOSIS — R188 Other ascites: Secondary | ICD-10-CM | POA: Diagnosis not present

## 2020-09-07 DIAGNOSIS — E669 Obesity, unspecified: Secondary | ICD-10-CM | POA: Diagnosis not present

## 2020-09-07 DIAGNOSIS — E119 Type 2 diabetes mellitus without complications: Secondary | ICD-10-CM | POA: Diagnosis not present

## 2020-09-07 DIAGNOSIS — C799 Secondary malignant neoplasm of unspecified site: Secondary | ICD-10-CM | POA: Diagnosis not present

## 2020-09-07 DIAGNOSIS — Z483 Aftercare following surgery for neoplasm: Secondary | ICD-10-CM | POA: Diagnosis not present

## 2020-09-07 DIAGNOSIS — Z9049 Acquired absence of other specified parts of digestive tract: Secondary | ICD-10-CM | POA: Diagnosis not present

## 2020-09-07 DIAGNOSIS — Z7984 Long term (current) use of oral hypoglycemic drugs: Secondary | ICD-10-CM | POA: Diagnosis not present

## 2020-09-07 DIAGNOSIS — Z90710 Acquired absence of both cervix and uterus: Secondary | ICD-10-CM | POA: Diagnosis not present

## 2020-09-07 DIAGNOSIS — C541 Malignant neoplasm of endometrium: Secondary | ICD-10-CM | POA: Diagnosis not present

## 2020-09-07 DIAGNOSIS — Z6830 Body mass index (BMI) 30.0-30.9, adult: Secondary | ICD-10-CM | POA: Diagnosis not present

## 2020-09-07 DIAGNOSIS — I10 Essential (primary) hypertension: Secondary | ICD-10-CM | POA: Diagnosis not present

## 2020-09-08 DIAGNOSIS — Z483 Aftercare following surgery for neoplasm: Secondary | ICD-10-CM | POA: Diagnosis not present

## 2020-09-08 DIAGNOSIS — Z7984 Long term (current) use of oral hypoglycemic drugs: Secondary | ICD-10-CM | POA: Diagnosis not present

## 2020-09-08 DIAGNOSIS — C799 Secondary malignant neoplasm of unspecified site: Secondary | ICD-10-CM | POA: Diagnosis not present

## 2020-09-08 DIAGNOSIS — C541 Malignant neoplasm of endometrium: Secondary | ICD-10-CM | POA: Diagnosis not present

## 2020-09-08 DIAGNOSIS — Z6830 Body mass index (BMI) 30.0-30.9, adult: Secondary | ICD-10-CM | POA: Diagnosis not present

## 2020-09-08 DIAGNOSIS — Z90710 Acquired absence of both cervix and uterus: Secondary | ICD-10-CM | POA: Diagnosis not present

## 2020-09-08 DIAGNOSIS — Z9049 Acquired absence of other specified parts of digestive tract: Secondary | ICD-10-CM | POA: Diagnosis not present

## 2020-09-08 DIAGNOSIS — I10 Essential (primary) hypertension: Secondary | ICD-10-CM | POA: Diagnosis not present

## 2020-09-08 DIAGNOSIS — E669 Obesity, unspecified: Secondary | ICD-10-CM | POA: Diagnosis not present

## 2020-09-08 DIAGNOSIS — E119 Type 2 diabetes mellitus without complications: Secondary | ICD-10-CM | POA: Diagnosis not present

## 2020-09-09 DIAGNOSIS — Z7984 Long term (current) use of oral hypoglycemic drugs: Secondary | ICD-10-CM | POA: Diagnosis not present

## 2020-09-09 DIAGNOSIS — Z90710 Acquired absence of both cervix and uterus: Secondary | ICD-10-CM | POA: Diagnosis not present

## 2020-09-09 DIAGNOSIS — E119 Type 2 diabetes mellitus without complications: Secondary | ICD-10-CM | POA: Diagnosis not present

## 2020-09-09 DIAGNOSIS — C799 Secondary malignant neoplasm of unspecified site: Secondary | ICD-10-CM | POA: Diagnosis not present

## 2020-09-09 DIAGNOSIS — I10 Essential (primary) hypertension: Secondary | ICD-10-CM | POA: Diagnosis not present

## 2020-09-09 DIAGNOSIS — Z483 Aftercare following surgery for neoplasm: Secondary | ICD-10-CM | POA: Diagnosis not present

## 2020-09-09 DIAGNOSIS — Z9049 Acquired absence of other specified parts of digestive tract: Secondary | ICD-10-CM | POA: Diagnosis not present

## 2020-09-09 DIAGNOSIS — Z6830 Body mass index (BMI) 30.0-30.9, adult: Secondary | ICD-10-CM | POA: Diagnosis not present

## 2020-09-09 DIAGNOSIS — E669 Obesity, unspecified: Secondary | ICD-10-CM | POA: Diagnosis not present

## 2020-09-09 DIAGNOSIS — C541 Malignant neoplasm of endometrium: Secondary | ICD-10-CM | POA: Diagnosis not present

## 2020-09-13 DIAGNOSIS — C541 Malignant neoplasm of endometrium: Secondary | ICD-10-CM | POA: Diagnosis not present

## 2020-09-13 DIAGNOSIS — I10 Essential (primary) hypertension: Secondary | ICD-10-CM | POA: Diagnosis not present

## 2020-09-13 DIAGNOSIS — E669 Obesity, unspecified: Secondary | ICD-10-CM | POA: Diagnosis not present

## 2020-09-13 DIAGNOSIS — Z6830 Body mass index (BMI) 30.0-30.9, adult: Secondary | ICD-10-CM | POA: Diagnosis not present

## 2020-09-13 DIAGNOSIS — Z9049 Acquired absence of other specified parts of digestive tract: Secondary | ICD-10-CM | POA: Diagnosis not present

## 2020-09-13 DIAGNOSIS — Z483 Aftercare following surgery for neoplasm: Secondary | ICD-10-CM | POA: Diagnosis not present

## 2020-09-13 DIAGNOSIS — Z90710 Acquired absence of both cervix and uterus: Secondary | ICD-10-CM | POA: Diagnosis not present

## 2020-09-13 DIAGNOSIS — Z7984 Long term (current) use of oral hypoglycemic drugs: Secondary | ICD-10-CM | POA: Diagnosis not present

## 2020-09-13 DIAGNOSIS — E119 Type 2 diabetes mellitus without complications: Secondary | ICD-10-CM | POA: Diagnosis not present

## 2020-09-13 DIAGNOSIS — C799 Secondary malignant neoplasm of unspecified site: Secondary | ICD-10-CM | POA: Diagnosis not present

## 2020-09-14 DIAGNOSIS — Z7984 Long term (current) use of oral hypoglycemic drugs: Secondary | ICD-10-CM | POA: Diagnosis not present

## 2020-09-14 DIAGNOSIS — Z483 Aftercare following surgery for neoplasm: Secondary | ICD-10-CM | POA: Diagnosis not present

## 2020-09-14 DIAGNOSIS — I10 Essential (primary) hypertension: Secondary | ICD-10-CM | POA: Diagnosis not present

## 2020-09-14 DIAGNOSIS — C799 Secondary malignant neoplasm of unspecified site: Secondary | ICD-10-CM | POA: Diagnosis not present

## 2020-09-14 DIAGNOSIS — Z9049 Acquired absence of other specified parts of digestive tract: Secondary | ICD-10-CM | POA: Diagnosis not present

## 2020-09-14 DIAGNOSIS — E119 Type 2 diabetes mellitus without complications: Secondary | ICD-10-CM | POA: Diagnosis not present

## 2020-09-14 DIAGNOSIS — Z90710 Acquired absence of both cervix and uterus: Secondary | ICD-10-CM | POA: Diagnosis not present

## 2020-09-14 DIAGNOSIS — C541 Malignant neoplasm of endometrium: Secondary | ICD-10-CM | POA: Diagnosis not present

## 2020-09-14 DIAGNOSIS — E669 Obesity, unspecified: Secondary | ICD-10-CM | POA: Diagnosis not present

## 2020-09-14 DIAGNOSIS — Z6830 Body mass index (BMI) 30.0-30.9, adult: Secondary | ICD-10-CM | POA: Diagnosis not present

## 2020-09-15 DIAGNOSIS — I10 Essential (primary) hypertension: Secondary | ICD-10-CM | POA: Diagnosis not present

## 2020-09-15 DIAGNOSIS — Z483 Aftercare following surgery for neoplasm: Secondary | ICD-10-CM | POA: Diagnosis not present

## 2020-09-15 DIAGNOSIS — Z90710 Acquired absence of both cervix and uterus: Secondary | ICD-10-CM | POA: Diagnosis not present

## 2020-09-15 DIAGNOSIS — Z6830 Body mass index (BMI) 30.0-30.9, adult: Secondary | ICD-10-CM | POA: Diagnosis not present

## 2020-09-15 DIAGNOSIS — C799 Secondary malignant neoplasm of unspecified site: Secondary | ICD-10-CM | POA: Diagnosis not present

## 2020-09-15 DIAGNOSIS — E119 Type 2 diabetes mellitus without complications: Secondary | ICD-10-CM | POA: Diagnosis not present

## 2020-09-15 DIAGNOSIS — C541 Malignant neoplasm of endometrium: Secondary | ICD-10-CM | POA: Diagnosis not present

## 2020-09-15 DIAGNOSIS — Z7984 Long term (current) use of oral hypoglycemic drugs: Secondary | ICD-10-CM | POA: Diagnosis not present

## 2020-09-15 DIAGNOSIS — E669 Obesity, unspecified: Secondary | ICD-10-CM | POA: Diagnosis not present

## 2020-09-15 DIAGNOSIS — Z9049 Acquired absence of other specified parts of digestive tract: Secondary | ICD-10-CM | POA: Diagnosis not present

## 2020-09-16 DIAGNOSIS — Z7984 Long term (current) use of oral hypoglycemic drugs: Secondary | ICD-10-CM | POA: Diagnosis not present

## 2020-09-16 DIAGNOSIS — Z90710 Acquired absence of both cervix and uterus: Secondary | ICD-10-CM | POA: Diagnosis not present

## 2020-09-16 DIAGNOSIS — Z9049 Acquired absence of other specified parts of digestive tract: Secondary | ICD-10-CM | POA: Diagnosis not present

## 2020-09-16 DIAGNOSIS — C799 Secondary malignant neoplasm of unspecified site: Secondary | ICD-10-CM | POA: Diagnosis not present

## 2020-09-16 DIAGNOSIS — Z6830 Body mass index (BMI) 30.0-30.9, adult: Secondary | ICD-10-CM | POA: Diagnosis not present

## 2020-09-16 DIAGNOSIS — E669 Obesity, unspecified: Secondary | ICD-10-CM | POA: Diagnosis not present

## 2020-09-16 DIAGNOSIS — C541 Malignant neoplasm of endometrium: Secondary | ICD-10-CM | POA: Diagnosis not present

## 2020-09-16 DIAGNOSIS — E119 Type 2 diabetes mellitus without complications: Secondary | ICD-10-CM | POA: Diagnosis not present

## 2020-09-16 DIAGNOSIS — I10 Essential (primary) hypertension: Secondary | ICD-10-CM | POA: Diagnosis not present

## 2020-09-16 DIAGNOSIS — Z483 Aftercare following surgery for neoplasm: Secondary | ICD-10-CM | POA: Diagnosis not present

## 2020-09-20 DIAGNOSIS — Z7984 Long term (current) use of oral hypoglycemic drugs: Secondary | ICD-10-CM | POA: Diagnosis not present

## 2020-09-20 DIAGNOSIS — Z90710 Acquired absence of both cervix and uterus: Secondary | ICD-10-CM | POA: Diagnosis not present

## 2020-09-20 DIAGNOSIS — Z9049 Acquired absence of other specified parts of digestive tract: Secondary | ICD-10-CM | POA: Diagnosis not present

## 2020-09-20 DIAGNOSIS — Z483 Aftercare following surgery for neoplasm: Secondary | ICD-10-CM | POA: Diagnosis not present

## 2020-09-20 DIAGNOSIS — E119 Type 2 diabetes mellitus without complications: Secondary | ICD-10-CM | POA: Diagnosis not present

## 2020-09-20 DIAGNOSIS — C541 Malignant neoplasm of endometrium: Secondary | ICD-10-CM | POA: Diagnosis not present

## 2020-09-20 DIAGNOSIS — I10 Essential (primary) hypertension: Secondary | ICD-10-CM | POA: Diagnosis not present

## 2020-09-20 DIAGNOSIS — C799 Secondary malignant neoplasm of unspecified site: Secondary | ICD-10-CM | POA: Diagnosis not present

## 2020-09-20 DIAGNOSIS — Z6830 Body mass index (BMI) 30.0-30.9, adult: Secondary | ICD-10-CM | POA: Diagnosis not present

## 2020-09-20 DIAGNOSIS — E669 Obesity, unspecified: Secondary | ICD-10-CM | POA: Diagnosis not present

## 2020-09-22 DIAGNOSIS — E669 Obesity, unspecified: Secondary | ICD-10-CM | POA: Diagnosis not present

## 2020-09-22 DIAGNOSIS — Z483 Aftercare following surgery for neoplasm: Secondary | ICD-10-CM | POA: Diagnosis not present

## 2020-09-22 DIAGNOSIS — Z7984 Long term (current) use of oral hypoglycemic drugs: Secondary | ICD-10-CM | POA: Diagnosis not present

## 2020-09-22 DIAGNOSIS — C799 Secondary malignant neoplasm of unspecified site: Secondary | ICD-10-CM | POA: Diagnosis not present

## 2020-09-22 DIAGNOSIS — Z9049 Acquired absence of other specified parts of digestive tract: Secondary | ICD-10-CM | POA: Diagnosis not present

## 2020-09-22 DIAGNOSIS — C541 Malignant neoplasm of endometrium: Secondary | ICD-10-CM | POA: Diagnosis not present

## 2020-09-22 DIAGNOSIS — Z90710 Acquired absence of both cervix and uterus: Secondary | ICD-10-CM | POA: Diagnosis not present

## 2020-09-22 DIAGNOSIS — I10 Essential (primary) hypertension: Secondary | ICD-10-CM | POA: Diagnosis not present

## 2020-09-22 DIAGNOSIS — Z6830 Body mass index (BMI) 30.0-30.9, adult: Secondary | ICD-10-CM | POA: Diagnosis not present

## 2020-09-22 DIAGNOSIS — E119 Type 2 diabetes mellitus without complications: Secondary | ICD-10-CM | POA: Diagnosis not present

## 2020-09-23 DIAGNOSIS — C541 Malignant neoplasm of endometrium: Secondary | ICD-10-CM | POA: Diagnosis not present

## 2020-09-23 DIAGNOSIS — Z6830 Body mass index (BMI) 30.0-30.9, adult: Secondary | ICD-10-CM | POA: Diagnosis not present

## 2020-09-23 DIAGNOSIS — Z9049 Acquired absence of other specified parts of digestive tract: Secondary | ICD-10-CM | POA: Diagnosis not present

## 2020-09-23 DIAGNOSIS — Z90710 Acquired absence of both cervix and uterus: Secondary | ICD-10-CM | POA: Diagnosis not present

## 2020-09-23 DIAGNOSIS — C799 Secondary malignant neoplasm of unspecified site: Secondary | ICD-10-CM | POA: Diagnosis not present

## 2020-09-23 DIAGNOSIS — E669 Obesity, unspecified: Secondary | ICD-10-CM | POA: Diagnosis not present

## 2020-09-23 DIAGNOSIS — E119 Type 2 diabetes mellitus without complications: Secondary | ICD-10-CM | POA: Diagnosis not present

## 2020-09-23 DIAGNOSIS — Z483 Aftercare following surgery for neoplasm: Secondary | ICD-10-CM | POA: Diagnosis not present

## 2020-09-23 DIAGNOSIS — I10 Essential (primary) hypertension: Secondary | ICD-10-CM | POA: Diagnosis not present

## 2020-09-23 DIAGNOSIS — Z7984 Long term (current) use of oral hypoglycemic drugs: Secondary | ICD-10-CM | POA: Diagnosis not present

## 2020-09-29 DIAGNOSIS — Z7984 Long term (current) use of oral hypoglycemic drugs: Secondary | ICD-10-CM | POA: Diagnosis not present

## 2020-09-29 DIAGNOSIS — E119 Type 2 diabetes mellitus without complications: Secondary | ICD-10-CM | POA: Diagnosis not present

## 2020-09-29 DIAGNOSIS — Z90710 Acquired absence of both cervix and uterus: Secondary | ICD-10-CM | POA: Diagnosis not present

## 2020-09-29 DIAGNOSIS — Z6830 Body mass index (BMI) 30.0-30.9, adult: Secondary | ICD-10-CM | POA: Diagnosis not present

## 2020-09-29 DIAGNOSIS — Z9049 Acquired absence of other specified parts of digestive tract: Secondary | ICD-10-CM | POA: Diagnosis not present

## 2020-09-29 DIAGNOSIS — Z483 Aftercare following surgery for neoplasm: Secondary | ICD-10-CM | POA: Diagnosis not present

## 2020-09-29 DIAGNOSIS — C541 Malignant neoplasm of endometrium: Secondary | ICD-10-CM | POA: Diagnosis not present

## 2020-09-29 DIAGNOSIS — E669 Obesity, unspecified: Secondary | ICD-10-CM | POA: Diagnosis not present

## 2020-09-29 DIAGNOSIS — C799 Secondary malignant neoplasm of unspecified site: Secondary | ICD-10-CM | POA: Diagnosis not present

## 2020-09-29 DIAGNOSIS — I10 Essential (primary) hypertension: Secondary | ICD-10-CM | POA: Diagnosis not present

## 2020-10-04 DIAGNOSIS — I10 Essential (primary) hypertension: Secondary | ICD-10-CM | POA: Diagnosis not present

## 2020-10-04 DIAGNOSIS — Z0000 Encounter for general adult medical examination without abnormal findings: Secondary | ICD-10-CM | POA: Diagnosis not present

## 2020-10-04 DIAGNOSIS — Z6828 Body mass index (BMI) 28.0-28.9, adult: Secondary | ICD-10-CM | POA: Diagnosis not present

## 2020-10-04 DIAGNOSIS — R69 Illness, unspecified: Secondary | ICD-10-CM | POA: Diagnosis not present

## 2020-10-04 DIAGNOSIS — E1143 Type 2 diabetes mellitus with diabetic autonomic (poly)neuropathy: Secondary | ICD-10-CM | POA: Diagnosis not present

## 2020-10-04 DIAGNOSIS — C541 Malignant neoplasm of endometrium: Secondary | ICD-10-CM | POA: Diagnosis not present

## 2020-10-04 DIAGNOSIS — E782 Mixed hyperlipidemia: Secondary | ICD-10-CM | POA: Diagnosis not present

## 2020-10-05 DIAGNOSIS — R110 Nausea: Secondary | ICD-10-CM | POA: Diagnosis not present

## 2020-10-05 DIAGNOSIS — E119 Type 2 diabetes mellitus without complications: Secondary | ICD-10-CM | POA: Diagnosis not present

## 2020-10-05 DIAGNOSIS — I10 Essential (primary) hypertension: Secondary | ICD-10-CM | POA: Diagnosis not present

## 2020-10-05 DIAGNOSIS — N179 Acute kidney failure, unspecified: Secondary | ICD-10-CM | POA: Diagnosis not present

## 2020-10-05 DIAGNOSIS — C786 Secondary malignant neoplasm of retroperitoneum and peritoneum: Secondary | ICD-10-CM | POA: Diagnosis not present

## 2020-10-05 DIAGNOSIS — R531 Weakness: Secondary | ICD-10-CM | POA: Diagnosis not present

## 2020-10-05 DIAGNOSIS — R29898 Other symptoms and signs involving the musculoskeletal system: Secondary | ICD-10-CM | POA: Diagnosis not present

## 2020-10-05 DIAGNOSIS — R112 Nausea with vomiting, unspecified: Secondary | ICD-10-CM | POA: Diagnosis not present

## 2020-10-05 DIAGNOSIS — R197 Diarrhea, unspecified: Secondary | ICD-10-CM | POA: Diagnosis not present

## 2020-10-05 DIAGNOSIS — C541 Malignant neoplasm of endometrium: Secondary | ICD-10-CM | POA: Diagnosis not present

## 2020-10-05 DIAGNOSIS — Z933 Colostomy status: Secondary | ICD-10-CM | POA: Diagnosis not present

## 2020-10-06 DIAGNOSIS — R197 Diarrhea, unspecified: Secondary | ICD-10-CM | POA: Diagnosis not present

## 2020-10-06 DIAGNOSIS — I10 Essential (primary) hypertension: Secondary | ICD-10-CM | POA: Diagnosis not present

## 2020-10-06 DIAGNOSIS — C541 Malignant neoplasm of endometrium: Secondary | ICD-10-CM | POA: Diagnosis not present

## 2020-10-06 DIAGNOSIS — R110 Nausea: Secondary | ICD-10-CM | POA: Diagnosis not present

## 2020-10-06 DIAGNOSIS — N179 Acute kidney failure, unspecified: Secondary | ICD-10-CM | POA: Diagnosis not present

## 2020-10-07 DIAGNOSIS — R197 Diarrhea, unspecified: Secondary | ICD-10-CM | POA: Diagnosis not present

## 2020-10-07 DIAGNOSIS — I10 Essential (primary) hypertension: Secondary | ICD-10-CM | POA: Diagnosis not present

## 2020-10-07 DIAGNOSIS — N179 Acute kidney failure, unspecified: Secondary | ICD-10-CM | POA: Diagnosis not present

## 2020-10-07 DIAGNOSIS — C541 Malignant neoplasm of endometrium: Secondary | ICD-10-CM | POA: Diagnosis not present

## 2020-10-07 DIAGNOSIS — R110 Nausea: Secondary | ICD-10-CM | POA: Diagnosis not present

## 2020-10-10 DIAGNOSIS — I10 Essential (primary) hypertension: Secondary | ICD-10-CM | POA: Diagnosis not present

## 2020-10-10 DIAGNOSIS — R197 Diarrhea, unspecified: Secondary | ICD-10-CM | POA: Diagnosis not present

## 2020-10-10 DIAGNOSIS — E669 Obesity, unspecified: Secondary | ICD-10-CM | POA: Diagnosis not present

## 2020-10-10 DIAGNOSIS — Z8542 Personal history of malignant neoplasm of other parts of uterus: Secondary | ICD-10-CM | POA: Diagnosis not present

## 2020-10-10 DIAGNOSIS — Z79891 Long term (current) use of opiate analgesic: Secondary | ICD-10-CM | POA: Diagnosis not present

## 2020-10-10 DIAGNOSIS — E119 Type 2 diabetes mellitus without complications: Secondary | ICD-10-CM | POA: Diagnosis not present

## 2020-10-10 DIAGNOSIS — Z90710 Acquired absence of both cervix and uterus: Secondary | ICD-10-CM | POA: Diagnosis not present

## 2020-10-10 DIAGNOSIS — Z9049 Acquired absence of other specified parts of digestive tract: Secondary | ICD-10-CM | POA: Diagnosis not present

## 2020-10-10 DIAGNOSIS — Z483 Aftercare following surgery for neoplasm: Secondary | ICD-10-CM | POA: Diagnosis not present

## 2020-10-10 DIAGNOSIS — Z7984 Long term (current) use of oral hypoglycemic drugs: Secondary | ICD-10-CM | POA: Diagnosis not present

## 2020-10-12 DIAGNOSIS — Z90710 Acquired absence of both cervix and uterus: Secondary | ICD-10-CM | POA: Diagnosis not present

## 2020-10-12 DIAGNOSIS — E119 Type 2 diabetes mellitus without complications: Secondary | ICD-10-CM | POA: Diagnosis not present

## 2020-10-12 DIAGNOSIS — Z7984 Long term (current) use of oral hypoglycemic drugs: Secondary | ICD-10-CM | POA: Diagnosis not present

## 2020-10-12 DIAGNOSIS — I10 Essential (primary) hypertension: Secondary | ICD-10-CM | POA: Diagnosis not present

## 2020-10-12 DIAGNOSIS — R197 Diarrhea, unspecified: Secondary | ICD-10-CM | POA: Diagnosis not present

## 2020-10-12 DIAGNOSIS — Z483 Aftercare following surgery for neoplasm: Secondary | ICD-10-CM | POA: Diagnosis not present

## 2020-10-12 DIAGNOSIS — Z8542 Personal history of malignant neoplasm of other parts of uterus: Secondary | ICD-10-CM | POA: Diagnosis not present

## 2020-10-12 DIAGNOSIS — Z9049 Acquired absence of other specified parts of digestive tract: Secondary | ICD-10-CM | POA: Diagnosis not present

## 2020-10-12 DIAGNOSIS — Z79891 Long term (current) use of opiate analgesic: Secondary | ICD-10-CM | POA: Diagnosis not present

## 2020-10-12 DIAGNOSIS — E669 Obesity, unspecified: Secondary | ICD-10-CM | POA: Diagnosis not present

## 2020-10-13 DIAGNOSIS — C541 Malignant neoplasm of endometrium: Secondary | ICD-10-CM | POA: Diagnosis not present

## 2020-10-14 DIAGNOSIS — Z9049 Acquired absence of other specified parts of digestive tract: Secondary | ICD-10-CM | POA: Diagnosis not present

## 2020-10-14 DIAGNOSIS — R933 Abnormal findings on diagnostic imaging of other parts of digestive tract: Secondary | ICD-10-CM | POA: Diagnosis not present

## 2020-10-14 DIAGNOSIS — K5641 Fecal impaction: Secondary | ICD-10-CM | POA: Diagnosis not present

## 2020-10-14 DIAGNOSIS — Z7984 Long term (current) use of oral hypoglycemic drugs: Secondary | ICD-10-CM | POA: Diagnosis not present

## 2020-10-14 DIAGNOSIS — N189 Chronic kidney disease, unspecified: Secondary | ICD-10-CM | POA: Diagnosis not present

## 2020-10-14 DIAGNOSIS — Z90710 Acquired absence of both cervix and uterus: Secondary | ICD-10-CM | POA: Diagnosis not present

## 2020-10-14 DIAGNOSIS — Z8542 Personal history of malignant neoplasm of other parts of uterus: Secondary | ICD-10-CM | POA: Diagnosis not present

## 2020-10-14 DIAGNOSIS — R180 Malignant ascites: Secondary | ICD-10-CM | POA: Diagnosis not present

## 2020-10-14 DIAGNOSIS — E43 Unspecified severe protein-calorie malnutrition: Secondary | ICD-10-CM | POA: Diagnosis not present

## 2020-10-14 DIAGNOSIS — C541 Malignant neoplasm of endometrium: Secondary | ICD-10-CM | POA: Diagnosis not present

## 2020-10-14 DIAGNOSIS — K644 Residual hemorrhoidal skin tags: Secondary | ICD-10-CM | POA: Diagnosis not present

## 2020-10-14 DIAGNOSIS — R112 Nausea with vomiting, unspecified: Secondary | ICD-10-CM | POA: Diagnosis not present

## 2020-10-14 DIAGNOSIS — E1122 Type 2 diabetes mellitus with diabetic chronic kidney disease: Secondary | ICD-10-CM | POA: Diagnosis not present

## 2020-10-14 DIAGNOSIS — E119 Type 2 diabetes mellitus without complications: Secondary | ICD-10-CM | POA: Diagnosis not present

## 2020-10-14 DIAGNOSIS — R197 Diarrhea, unspecified: Secondary | ICD-10-CM | POA: Diagnosis not present

## 2020-10-14 DIAGNOSIS — Z20822 Contact with and (suspected) exposure to covid-19: Secondary | ICD-10-CM | POA: Diagnosis not present

## 2020-10-14 DIAGNOSIS — N179 Acute kidney failure, unspecified: Secondary | ICD-10-CM | POA: Diagnosis not present

## 2020-10-14 DIAGNOSIS — R188 Other ascites: Secondary | ICD-10-CM | POA: Diagnosis not present

## 2020-10-14 DIAGNOSIS — I10 Essential (primary) hypertension: Secondary | ICD-10-CM | POA: Diagnosis not present

## 2020-10-14 DIAGNOSIS — Z933 Colostomy status: Secondary | ICD-10-CM | POA: Diagnosis not present

## 2020-10-14 DIAGNOSIS — R638 Other symptoms and signs concerning food and fluid intake: Secondary | ICD-10-CM | POA: Diagnosis not present

## 2020-10-14 DIAGNOSIS — D638 Anemia in other chronic diseases classified elsewhere: Secondary | ICD-10-CM | POA: Diagnosis not present

## 2020-10-14 DIAGNOSIS — I129 Hypertensive chronic kidney disease with stage 1 through stage 4 chronic kidney disease, or unspecified chronic kidney disease: Secondary | ICD-10-CM | POA: Diagnosis not present

## 2020-10-15 DIAGNOSIS — K644 Residual hemorrhoidal skin tags: Secondary | ICD-10-CM | POA: Diagnosis not present

## 2020-10-15 DIAGNOSIS — N179 Acute kidney failure, unspecified: Secondary | ICD-10-CM | POA: Diagnosis not present

## 2020-10-15 DIAGNOSIS — I129 Hypertensive chronic kidney disease with stage 1 through stage 4 chronic kidney disease, or unspecified chronic kidney disease: Secondary | ICD-10-CM | POA: Diagnosis not present

## 2020-10-15 DIAGNOSIS — I10 Essential (primary) hypertension: Secondary | ICD-10-CM | POA: Diagnosis not present

## 2020-10-15 DIAGNOSIS — E1122 Type 2 diabetes mellitus with diabetic chronic kidney disease: Secondary | ICD-10-CM | POA: Diagnosis not present

## 2020-10-15 DIAGNOSIS — N189 Chronic kidney disease, unspecified: Secondary | ICD-10-CM | POA: Diagnosis not present

## 2020-10-15 DIAGNOSIS — Z20822 Contact with and (suspected) exposure to covid-19: Secondary | ICD-10-CM | POA: Diagnosis not present

## 2020-10-15 DIAGNOSIS — Z7984 Long term (current) use of oral hypoglycemic drugs: Secondary | ICD-10-CM | POA: Diagnosis not present

## 2020-10-15 DIAGNOSIS — E119 Type 2 diabetes mellitus without complications: Secondary | ICD-10-CM | POA: Diagnosis not present

## 2020-10-15 DIAGNOSIS — K5641 Fecal impaction: Secondary | ICD-10-CM | POA: Diagnosis not present

## 2020-10-15 DIAGNOSIS — E43 Unspecified severe protein-calorie malnutrition: Secondary | ICD-10-CM | POA: Diagnosis not present

## 2020-10-15 DIAGNOSIS — R933 Abnormal findings on diagnostic imaging of other parts of digestive tract: Secondary | ICD-10-CM | POA: Diagnosis not present

## 2020-10-15 DIAGNOSIS — D638 Anemia in other chronic diseases classified elsewhere: Secondary | ICD-10-CM | POA: Diagnosis not present

## 2020-10-15 DIAGNOSIS — R188 Other ascites: Secondary | ICD-10-CM | POA: Diagnosis not present

## 2020-10-15 DIAGNOSIS — R180 Malignant ascites: Secondary | ICD-10-CM | POA: Diagnosis not present

## 2020-10-15 DIAGNOSIS — Z933 Colostomy status: Secondary | ICD-10-CM | POA: Diagnosis not present

## 2020-10-15 DIAGNOSIS — C541 Malignant neoplasm of endometrium: Secondary | ICD-10-CM | POA: Diagnosis not present

## 2020-10-15 DIAGNOSIS — R112 Nausea with vomiting, unspecified: Secondary | ICD-10-CM | POA: Diagnosis not present

## 2020-10-16 DIAGNOSIS — D638 Anemia in other chronic diseases classified elsewhere: Secondary | ICD-10-CM | POA: Diagnosis not present

## 2020-10-16 DIAGNOSIS — E119 Type 2 diabetes mellitus without complications: Secondary | ICD-10-CM | POA: Diagnosis not present

## 2020-10-16 DIAGNOSIS — K5641 Fecal impaction: Secondary | ICD-10-CM | POA: Diagnosis not present

## 2020-10-16 DIAGNOSIS — I10 Essential (primary) hypertension: Secondary | ICD-10-CM | POA: Diagnosis not present

## 2020-10-16 DIAGNOSIS — C541 Malignant neoplasm of endometrium: Secondary | ICD-10-CM | POA: Diagnosis not present

## 2020-10-16 DIAGNOSIS — R112 Nausea with vomiting, unspecified: Secondary | ICD-10-CM | POA: Diagnosis not present

## 2020-10-17 DIAGNOSIS — I10 Essential (primary) hypertension: Secondary | ICD-10-CM | POA: Diagnosis not present

## 2020-10-17 DIAGNOSIS — C541 Malignant neoplasm of endometrium: Secondary | ICD-10-CM | POA: Diagnosis not present

## 2020-10-17 DIAGNOSIS — D638 Anemia in other chronic diseases classified elsewhere: Secondary | ICD-10-CM | POA: Diagnosis not present

## 2020-10-17 DIAGNOSIS — E119 Type 2 diabetes mellitus without complications: Secondary | ICD-10-CM | POA: Diagnosis not present

## 2020-10-17 DIAGNOSIS — R112 Nausea with vomiting, unspecified: Secondary | ICD-10-CM | POA: Diagnosis not present

## 2020-10-17 DIAGNOSIS — R180 Malignant ascites: Secondary | ICD-10-CM | POA: Diagnosis not present

## 2020-10-17 DIAGNOSIS — K5641 Fecal impaction: Secondary | ICD-10-CM | POA: Diagnosis not present

## 2020-10-18 DIAGNOSIS — R112 Nausea with vomiting, unspecified: Secondary | ICD-10-CM | POA: Diagnosis not present

## 2020-10-18 DIAGNOSIS — D638 Anemia in other chronic diseases classified elsewhere: Secondary | ICD-10-CM | POA: Diagnosis not present

## 2020-10-18 DIAGNOSIS — K644 Residual hemorrhoidal skin tags: Secondary | ICD-10-CM | POA: Diagnosis not present

## 2020-10-18 DIAGNOSIS — I10 Essential (primary) hypertension: Secondary | ICD-10-CM | POA: Diagnosis not present

## 2020-10-18 DIAGNOSIS — E119 Type 2 diabetes mellitus without complications: Secondary | ICD-10-CM | POA: Diagnosis not present

## 2020-10-18 DIAGNOSIS — R933 Abnormal findings on diagnostic imaging of other parts of digestive tract: Secondary | ICD-10-CM | POA: Diagnosis not present

## 2020-10-18 DIAGNOSIS — K5641 Fecal impaction: Secondary | ICD-10-CM | POA: Diagnosis not present

## 2020-10-19 DIAGNOSIS — R188 Other ascites: Secondary | ICD-10-CM | POA: Diagnosis not present

## 2020-10-19 DIAGNOSIS — I10 Essential (primary) hypertension: Secondary | ICD-10-CM | POA: Diagnosis not present

## 2020-10-19 DIAGNOSIS — R112 Nausea with vomiting, unspecified: Secondary | ICD-10-CM | POA: Diagnosis not present

## 2020-10-19 DIAGNOSIS — K5641 Fecal impaction: Secondary | ICD-10-CM | POA: Diagnosis not present

## 2020-10-19 DIAGNOSIS — E119 Type 2 diabetes mellitus without complications: Secondary | ICD-10-CM | POA: Diagnosis not present

## 2020-10-19 DIAGNOSIS — D638 Anemia in other chronic diseases classified elsewhere: Secondary | ICD-10-CM | POA: Diagnosis not present

## 2020-10-20 DIAGNOSIS — D638 Anemia in other chronic diseases classified elsewhere: Secondary | ICD-10-CM | POA: Diagnosis not present

## 2020-10-20 DIAGNOSIS — K5641 Fecal impaction: Secondary | ICD-10-CM | POA: Diagnosis not present

## 2020-10-20 DIAGNOSIS — I10 Essential (primary) hypertension: Secondary | ICD-10-CM | POA: Diagnosis not present

## 2020-10-20 DIAGNOSIS — E119 Type 2 diabetes mellitus without complications: Secondary | ICD-10-CM | POA: Diagnosis not present

## 2020-10-20 DIAGNOSIS — R112 Nausea with vomiting, unspecified: Secondary | ICD-10-CM | POA: Diagnosis not present

## 2020-10-21 DIAGNOSIS — D638 Anemia in other chronic diseases classified elsewhere: Secondary | ICD-10-CM | POA: Diagnosis not present

## 2020-10-21 DIAGNOSIS — R112 Nausea with vomiting, unspecified: Secondary | ICD-10-CM | POA: Diagnosis not present

## 2020-10-21 DIAGNOSIS — K5641 Fecal impaction: Secondary | ICD-10-CM | POA: Diagnosis not present

## 2020-10-21 DIAGNOSIS — E119 Type 2 diabetes mellitus without complications: Secondary | ICD-10-CM | POA: Diagnosis not present

## 2020-10-21 DIAGNOSIS — I10 Essential (primary) hypertension: Secondary | ICD-10-CM | POA: Diagnosis not present

## 2020-10-22 DIAGNOSIS — E119 Type 2 diabetes mellitus without complications: Secondary | ICD-10-CM | POA: Diagnosis not present

## 2020-10-22 DIAGNOSIS — D638 Anemia in other chronic diseases classified elsewhere: Secondary | ICD-10-CM | POA: Diagnosis not present

## 2020-10-22 DIAGNOSIS — R112 Nausea with vomiting, unspecified: Secondary | ICD-10-CM | POA: Diagnosis not present

## 2020-10-22 DIAGNOSIS — K5641 Fecal impaction: Secondary | ICD-10-CM | POA: Diagnosis not present

## 2020-10-22 DIAGNOSIS — I10 Essential (primary) hypertension: Secondary | ICD-10-CM | POA: Diagnosis not present

## 2020-10-24 DIAGNOSIS — R0602 Shortness of breath: Secondary | ICD-10-CM | POA: Diagnosis not present

## 2020-10-24 DIAGNOSIS — C541 Malignant neoplasm of endometrium: Secondary | ICD-10-CM | POA: Diagnosis not present

## 2020-10-24 DIAGNOSIS — R110 Nausea: Secondary | ICD-10-CM | POA: Diagnosis not present

## 2020-10-24 DIAGNOSIS — M7989 Other specified soft tissue disorders: Secondary | ICD-10-CM | POA: Diagnosis not present

## 2020-10-24 DIAGNOSIS — D638 Anemia in other chronic diseases classified elsewhere: Secondary | ICD-10-CM | POA: Diagnosis not present

## 2020-10-24 DIAGNOSIS — R600 Localized edema: Secondary | ICD-10-CM | POA: Diagnosis not present

## 2020-10-25 DIAGNOSIS — C541 Malignant neoplasm of endometrium: Secondary | ICD-10-CM | POA: Diagnosis not present

## 2020-10-25 DIAGNOSIS — J90 Pleural effusion, not elsewhere classified: Secondary | ICD-10-CM | POA: Diagnosis not present

## 2020-10-25 DIAGNOSIS — E872 Acidosis: Secondary | ICD-10-CM | POA: Diagnosis not present

## 2020-10-25 DIAGNOSIS — R778 Other specified abnormalities of plasma proteins: Secondary | ICD-10-CM | POA: Diagnosis not present

## 2020-10-25 DIAGNOSIS — R110 Nausea: Secondary | ICD-10-CM | POA: Diagnosis not present

## 2020-10-25 DIAGNOSIS — R0602 Shortness of breath: Secondary | ICD-10-CM | POA: Diagnosis not present

## 2020-10-25 DIAGNOSIS — J9601 Acute respiratory failure with hypoxia: Secondary | ICD-10-CM | POA: Diagnosis not present

## 2020-10-26 DIAGNOSIS — R110 Nausea: Secondary | ICD-10-CM | POA: Diagnosis not present

## 2020-10-26 DIAGNOSIS — R600 Localized edema: Secondary | ICD-10-CM | POA: Diagnosis not present

## 2020-10-26 DIAGNOSIS — D638 Anemia in other chronic diseases classified elsewhere: Secondary | ICD-10-CM | POA: Diagnosis not present

## 2020-10-26 DIAGNOSIS — J9601 Acute respiratory failure with hypoxia: Secondary | ICD-10-CM | POA: Diagnosis not present

## 2020-10-26 DIAGNOSIS — R0602 Shortness of breath: Secondary | ICD-10-CM | POA: Diagnosis not present

## 2020-10-26 DIAGNOSIS — I4510 Unspecified right bundle-branch block: Secondary | ICD-10-CM | POA: Diagnosis not present

## 2020-10-27 DIAGNOSIS — N189 Chronic kidney disease, unspecified: Secondary | ICD-10-CM | POA: Diagnosis not present

## 2020-10-27 DIAGNOSIS — T886XXA Anaphylactic reaction due to adverse effect of correct drug or medicament properly administered, initial encounter: Secondary | ICD-10-CM | POA: Diagnosis not present

## 2020-10-27 DIAGNOSIS — Z515 Encounter for palliative care: Secondary | ICD-10-CM | POA: Diagnosis not present

## 2020-10-27 DIAGNOSIS — J9601 Acute respiratory failure with hypoxia: Secondary | ICD-10-CM | POA: Diagnosis not present

## 2020-10-27 DIAGNOSIS — Z66 Do not resuscitate: Secondary | ICD-10-CM | POA: Diagnosis not present

## 2020-10-27 DIAGNOSIS — R918 Other nonspecific abnormal finding of lung field: Secondary | ICD-10-CM | POA: Diagnosis not present

## 2020-10-27 DIAGNOSIS — I5023 Acute on chronic systolic (congestive) heart failure: Secondary | ICD-10-CM | POA: Diagnosis not present

## 2020-10-27 DIAGNOSIS — R0602 Shortness of breath: Secondary | ICD-10-CM | POA: Diagnosis not present

## 2020-10-27 DIAGNOSIS — E873 Alkalosis: Secondary | ICD-10-CM | POA: Diagnosis not present

## 2020-10-27 DIAGNOSIS — I4510 Unspecified right bundle-branch block: Secondary | ICD-10-CM | POA: Diagnosis not present

## 2020-10-27 DIAGNOSIS — R110 Nausea: Secondary | ICD-10-CM | POA: Diagnosis not present

## 2020-10-27 DIAGNOSIS — E1122 Type 2 diabetes mellitus with diabetic chronic kidney disease: Secondary | ICD-10-CM | POA: Diagnosis not present

## 2020-10-27 DIAGNOSIS — J984 Other disorders of lung: Secondary | ICD-10-CM | POA: Diagnosis not present

## 2020-10-27 DIAGNOSIS — I130 Hypertensive heart and chronic kidney disease with heart failure and stage 1 through stage 4 chronic kidney disease, or unspecified chronic kidney disease: Secondary | ICD-10-CM | POA: Diagnosis not present

## 2020-10-27 DIAGNOSIS — R197 Diarrhea, unspecified: Secondary | ICD-10-CM | POA: Diagnosis not present

## 2020-10-27 DIAGNOSIS — N179 Acute kidney failure, unspecified: Secondary | ICD-10-CM | POA: Diagnosis not present

## 2020-10-27 DIAGNOSIS — R180 Malignant ascites: Secondary | ICD-10-CM | POA: Diagnosis not present

## 2020-10-27 DIAGNOSIS — J90 Pleural effusion, not elsewhere classified: Secondary | ICD-10-CM | POA: Diagnosis not present

## 2020-10-27 DIAGNOSIS — M549 Dorsalgia, unspecified: Secondary | ICD-10-CM | POA: Diagnosis not present

## 2020-10-27 DIAGNOSIS — I5020 Unspecified systolic (congestive) heart failure: Secondary | ICD-10-CM | POA: Diagnosis not present

## 2020-10-27 DIAGNOSIS — I509 Heart failure, unspecified: Secondary | ICD-10-CM | POA: Diagnosis not present

## 2020-10-27 DIAGNOSIS — R079 Chest pain, unspecified: Secondary | ICD-10-CM | POA: Diagnosis not present

## 2020-10-27 DIAGNOSIS — R778 Other specified abnormalities of plasma proteins: Secondary | ICD-10-CM | POA: Diagnosis not present

## 2020-10-27 DIAGNOSIS — I110 Hypertensive heart disease with heart failure: Secondary | ICD-10-CM | POA: Diagnosis not present

## 2020-10-27 DIAGNOSIS — E872 Acidosis: Secondary | ICD-10-CM | POA: Diagnosis not present

## 2020-10-27 DIAGNOSIS — I5021 Acute systolic (congestive) heart failure: Secondary | ICD-10-CM | POA: Diagnosis not present

## 2020-10-27 DIAGNOSIS — K6389 Other specified diseases of intestine: Secondary | ICD-10-CM | POA: Diagnosis not present

## 2020-10-27 DIAGNOSIS — J810 Acute pulmonary edema: Secondary | ICD-10-CM | POA: Diagnosis not present

## 2020-10-27 DIAGNOSIS — R188 Other ascites: Secondary | ICD-10-CM | POA: Diagnosis not present

## 2020-10-27 DIAGNOSIS — R1310 Dysphagia, unspecified: Secondary | ICD-10-CM | POA: Diagnosis not present

## 2020-10-27 DIAGNOSIS — I517 Cardiomegaly: Secondary | ICD-10-CM | POA: Diagnosis not present

## 2020-10-27 DIAGNOSIS — C541 Malignant neoplasm of endometrium: Secondary | ICD-10-CM | POA: Diagnosis not present

## 2020-10-27 DIAGNOSIS — M79605 Pain in left leg: Secondary | ICD-10-CM | POA: Diagnosis not present

## 2020-10-27 DIAGNOSIS — R4182 Altered mental status, unspecified: Secondary | ICD-10-CM | POA: Diagnosis not present

## 2020-10-28 DIAGNOSIS — N179 Acute kidney failure, unspecified: Secondary | ICD-10-CM | POA: Diagnosis not present

## 2020-10-28 DIAGNOSIS — I5020 Unspecified systolic (congestive) heart failure: Secondary | ICD-10-CM | POA: Diagnosis not present

## 2020-10-28 DIAGNOSIS — J9601 Acute respiratory failure with hypoxia: Secondary | ICD-10-CM | POA: Diagnosis not present

## 2020-10-28 DIAGNOSIS — R110 Nausea: Secondary | ICD-10-CM | POA: Diagnosis not present

## 2020-10-28 DIAGNOSIS — R0602 Shortness of breath: Secondary | ICD-10-CM | POA: Diagnosis not present

## 2020-10-28 DIAGNOSIS — R197 Diarrhea, unspecified: Secondary | ICD-10-CM | POA: Diagnosis not present

## 2020-10-28 DIAGNOSIS — C541 Malignant neoplasm of endometrium: Secondary | ICD-10-CM | POA: Diagnosis not present

## 2020-10-28 DIAGNOSIS — E872 Acidosis: Secondary | ICD-10-CM | POA: Diagnosis not present

## 2020-10-28 DIAGNOSIS — J90 Pleural effusion, not elsewhere classified: Secondary | ICD-10-CM | POA: Diagnosis not present

## 2020-10-28 DIAGNOSIS — R778 Other specified abnormalities of plasma proteins: Secondary | ICD-10-CM | POA: Diagnosis not present

## 2020-10-29 DIAGNOSIS — E872 Acidosis: Secondary | ICD-10-CM | POA: Diagnosis not present

## 2020-10-29 DIAGNOSIS — I110 Hypertensive heart disease with heart failure: Secondary | ICD-10-CM | POA: Diagnosis not present

## 2020-10-29 DIAGNOSIS — J984 Other disorders of lung: Secondary | ICD-10-CM | POA: Diagnosis not present

## 2020-10-29 DIAGNOSIS — N179 Acute kidney failure, unspecified: Secondary | ICD-10-CM | POA: Diagnosis not present

## 2020-10-29 DIAGNOSIS — R197 Diarrhea, unspecified: Secondary | ICD-10-CM | POA: Diagnosis not present

## 2020-10-29 DIAGNOSIS — J90 Pleural effusion, not elsewhere classified: Secondary | ICD-10-CM | POA: Diagnosis not present

## 2020-10-29 DIAGNOSIS — I509 Heart failure, unspecified: Secondary | ICD-10-CM | POA: Diagnosis not present

## 2020-10-29 DIAGNOSIS — I5020 Unspecified systolic (congestive) heart failure: Secondary | ICD-10-CM | POA: Diagnosis not present

## 2020-10-29 DIAGNOSIS — I517 Cardiomegaly: Secondary | ICD-10-CM | POA: Diagnosis not present

## 2020-10-29 DIAGNOSIS — R778 Other specified abnormalities of plasma proteins: Secondary | ICD-10-CM | POA: Diagnosis not present

## 2020-10-29 DIAGNOSIS — R4182 Altered mental status, unspecified: Secondary | ICD-10-CM | POA: Diagnosis not present

## 2020-10-29 DIAGNOSIS — M79605 Pain in left leg: Secondary | ICD-10-CM | POA: Diagnosis not present

## 2020-10-29 DIAGNOSIS — M549 Dorsalgia, unspecified: Secondary | ICD-10-CM | POA: Diagnosis not present

## 2020-10-29 DIAGNOSIS — C541 Malignant neoplasm of endometrium: Secondary | ICD-10-CM | POA: Diagnosis not present

## 2020-10-29 DIAGNOSIS — J9601 Acute respiratory failure with hypoxia: Secondary | ICD-10-CM | POA: Diagnosis not present

## 2020-10-30 DIAGNOSIS — I509 Heart failure, unspecified: Secondary | ICD-10-CM | POA: Diagnosis not present

## 2020-10-30 DIAGNOSIS — I5023 Acute on chronic systolic (congestive) heart failure: Secondary | ICD-10-CM | POA: Diagnosis not present

## 2020-10-30 DIAGNOSIS — I5020 Unspecified systolic (congestive) heart failure: Secondary | ICD-10-CM | POA: Diagnosis not present

## 2020-10-30 DIAGNOSIS — J9601 Acute respiratory failure with hypoxia: Secondary | ICD-10-CM | POA: Diagnosis not present

## 2020-10-30 DIAGNOSIS — E872 Acidosis: Secondary | ICD-10-CM | POA: Diagnosis not present

## 2020-10-30 DIAGNOSIS — I130 Hypertensive heart and chronic kidney disease with heart failure and stage 1 through stage 4 chronic kidney disease, or unspecified chronic kidney disease: Secondary | ICD-10-CM | POA: Diagnosis not present

## 2020-10-30 DIAGNOSIS — R0602 Shortness of breath: Secondary | ICD-10-CM | POA: Diagnosis not present

## 2020-10-30 DIAGNOSIS — R4182 Altered mental status, unspecified: Secondary | ICD-10-CM | POA: Diagnosis not present

## 2020-10-30 DIAGNOSIS — N189 Chronic kidney disease, unspecified: Secondary | ICD-10-CM | POA: Diagnosis not present

## 2020-10-30 DIAGNOSIS — R778 Other specified abnormalities of plasma proteins: Secondary | ICD-10-CM | POA: Diagnosis not present

## 2020-10-30 DIAGNOSIS — N179 Acute kidney failure, unspecified: Secondary | ICD-10-CM | POA: Diagnosis not present

## 2020-10-30 DIAGNOSIS — I110 Hypertensive heart disease with heart failure: Secondary | ICD-10-CM | POA: Diagnosis not present

## 2020-10-30 DIAGNOSIS — C541 Malignant neoplasm of endometrium: Secondary | ICD-10-CM | POA: Diagnosis not present

## 2020-10-30 DIAGNOSIS — M549 Dorsalgia, unspecified: Secondary | ICD-10-CM | POA: Diagnosis not present

## 2020-10-30 DIAGNOSIS — E1122 Type 2 diabetes mellitus with diabetic chronic kidney disease: Secondary | ICD-10-CM | POA: Diagnosis not present

## 2020-10-30 DIAGNOSIS — M79605 Pain in left leg: Secondary | ICD-10-CM | POA: Diagnosis not present

## 2020-10-31 DIAGNOSIS — I5020 Unspecified systolic (congestive) heart failure: Secondary | ICD-10-CM | POA: Diagnosis not present

## 2020-10-31 DIAGNOSIS — M549 Dorsalgia, unspecified: Secondary | ICD-10-CM | POA: Diagnosis not present

## 2020-10-31 DIAGNOSIS — R4182 Altered mental status, unspecified: Secondary | ICD-10-CM | POA: Diagnosis not present

## 2020-10-31 DIAGNOSIS — J9601 Acute respiratory failure with hypoxia: Secondary | ICD-10-CM | POA: Diagnosis not present

## 2020-10-31 DIAGNOSIS — R778 Other specified abnormalities of plasma proteins: Secondary | ICD-10-CM | POA: Diagnosis not present

## 2020-10-31 DIAGNOSIS — E872 Acidosis: Secondary | ICD-10-CM | POA: Diagnosis not present

## 2020-10-31 DIAGNOSIS — I509 Heart failure, unspecified: Secondary | ICD-10-CM | POA: Diagnosis not present

## 2020-10-31 DIAGNOSIS — C541 Malignant neoplasm of endometrium: Secondary | ICD-10-CM | POA: Diagnosis not present

## 2020-10-31 DIAGNOSIS — M79605 Pain in left leg: Secondary | ICD-10-CM | POA: Diagnosis not present

## 2020-10-31 DIAGNOSIS — I110 Hypertensive heart disease with heart failure: Secondary | ICD-10-CM | POA: Diagnosis not present

## 2020-11-01 DIAGNOSIS — M549 Dorsalgia, unspecified: Secondary | ICD-10-CM | POA: Diagnosis not present

## 2020-11-01 DIAGNOSIS — M79605 Pain in left leg: Secondary | ICD-10-CM | POA: Diagnosis not present

## 2020-11-01 DIAGNOSIS — I110 Hypertensive heart disease with heart failure: Secondary | ICD-10-CM | POA: Diagnosis not present

## 2020-11-01 DIAGNOSIS — I5020 Unspecified systolic (congestive) heart failure: Secondary | ICD-10-CM | POA: Diagnosis not present

## 2020-11-01 DIAGNOSIS — J810 Acute pulmonary edema: Secondary | ICD-10-CM | POA: Diagnosis not present

## 2020-11-01 DIAGNOSIS — R4182 Altered mental status, unspecified: Secondary | ICD-10-CM | POA: Diagnosis not present

## 2020-11-01 DIAGNOSIS — E872 Acidosis: Secondary | ICD-10-CM | POA: Diagnosis not present

## 2020-11-01 DIAGNOSIS — R918 Other nonspecific abnormal finding of lung field: Secondary | ICD-10-CM | POA: Diagnosis not present

## 2020-11-01 DIAGNOSIS — R778 Other specified abnormalities of plasma proteins: Secondary | ICD-10-CM | POA: Diagnosis not present

## 2020-11-01 DIAGNOSIS — J9601 Acute respiratory failure with hypoxia: Secondary | ICD-10-CM | POA: Diagnosis not present

## 2020-11-01 DIAGNOSIS — J90 Pleural effusion, not elsewhere classified: Secondary | ICD-10-CM | POA: Diagnosis not present

## 2020-11-01 DIAGNOSIS — C541 Malignant neoplasm of endometrium: Secondary | ICD-10-CM | POA: Diagnosis not present

## 2020-11-01 DIAGNOSIS — I509 Heart failure, unspecified: Secondary | ICD-10-CM | POA: Diagnosis not present

## 2020-11-02 DIAGNOSIS — M549 Dorsalgia, unspecified: Secondary | ICD-10-CM | POA: Diagnosis not present

## 2020-11-02 DIAGNOSIS — I5020 Unspecified systolic (congestive) heart failure: Secondary | ICD-10-CM | POA: Diagnosis not present

## 2020-11-02 DIAGNOSIS — I509 Heart failure, unspecified: Secondary | ICD-10-CM | POA: Diagnosis not present

## 2020-11-02 DIAGNOSIS — E872 Acidosis: Secondary | ICD-10-CM | POA: Diagnosis not present

## 2020-11-02 DIAGNOSIS — M79605 Pain in left leg: Secondary | ICD-10-CM | POA: Diagnosis not present

## 2020-11-02 DIAGNOSIS — J9601 Acute respiratory failure with hypoxia: Secondary | ICD-10-CM | POA: Diagnosis not present

## 2020-11-02 DIAGNOSIS — R4182 Altered mental status, unspecified: Secondary | ICD-10-CM | POA: Diagnosis not present

## 2020-11-02 DIAGNOSIS — I110 Hypertensive heart disease with heart failure: Secondary | ICD-10-CM | POA: Diagnosis not present

## 2020-11-02 DIAGNOSIS — R778 Other specified abnormalities of plasma proteins: Secondary | ICD-10-CM | POA: Diagnosis not present

## 2020-11-02 DIAGNOSIS — C541 Malignant neoplasm of endometrium: Secondary | ICD-10-CM | POA: Diagnosis not present

## 2020-11-03 DIAGNOSIS — I509 Heart failure, unspecified: Secondary | ICD-10-CM | POA: Diagnosis not present

## 2020-11-03 DIAGNOSIS — M549 Dorsalgia, unspecified: Secondary | ICD-10-CM | POA: Diagnosis not present

## 2020-11-03 DIAGNOSIS — C541 Malignant neoplasm of endometrium: Secondary | ICD-10-CM | POA: Diagnosis not present

## 2020-11-03 DIAGNOSIS — M79605 Pain in left leg: Secondary | ICD-10-CM | POA: Diagnosis not present

## 2020-11-03 DIAGNOSIS — R079 Chest pain, unspecified: Secondary | ICD-10-CM | POA: Diagnosis not present

## 2020-11-03 DIAGNOSIS — R197 Diarrhea, unspecified: Secondary | ICD-10-CM | POA: Diagnosis not present

## 2020-11-03 DIAGNOSIS — R4182 Altered mental status, unspecified: Secondary | ICD-10-CM | POA: Diagnosis not present

## 2020-11-03 DIAGNOSIS — I110 Hypertensive heart disease with heart failure: Secondary | ICD-10-CM | POA: Diagnosis not present

## 2020-11-04 DIAGNOSIS — I509 Heart failure, unspecified: Secondary | ICD-10-CM | POA: Diagnosis not present

## 2020-11-04 DIAGNOSIS — R079 Chest pain, unspecified: Secondary | ICD-10-CM | POA: Diagnosis not present

## 2020-11-04 DIAGNOSIS — I110 Hypertensive heart disease with heart failure: Secondary | ICD-10-CM | POA: Diagnosis not present

## 2020-11-04 DIAGNOSIS — M549 Dorsalgia, unspecified: Secondary | ICD-10-CM | POA: Diagnosis not present

## 2020-11-04 DIAGNOSIS — R197 Diarrhea, unspecified: Secondary | ICD-10-CM | POA: Diagnosis not present

## 2020-11-04 DIAGNOSIS — C541 Malignant neoplasm of endometrium: Secondary | ICD-10-CM | POA: Diagnosis not present

## 2020-11-04 DIAGNOSIS — M79605 Pain in left leg: Secondary | ICD-10-CM | POA: Diagnosis not present

## 2020-11-04 DIAGNOSIS — R4182 Altered mental status, unspecified: Secondary | ICD-10-CM | POA: Diagnosis not present

## 2020-11-05 DIAGNOSIS — I509 Heart failure, unspecified: Secondary | ICD-10-CM | POA: Diagnosis not present

## 2020-11-05 DIAGNOSIS — M79605 Pain in left leg: Secondary | ICD-10-CM | POA: Diagnosis not present

## 2020-11-05 DIAGNOSIS — R4182 Altered mental status, unspecified: Secondary | ICD-10-CM | POA: Diagnosis not present

## 2020-11-05 DIAGNOSIS — M549 Dorsalgia, unspecified: Secondary | ICD-10-CM | POA: Diagnosis not present

## 2020-11-05 DIAGNOSIS — R079 Chest pain, unspecified: Secondary | ICD-10-CM | POA: Diagnosis not present

## 2020-11-05 DIAGNOSIS — R197 Diarrhea, unspecified: Secondary | ICD-10-CM | POA: Diagnosis not present

## 2020-11-05 DIAGNOSIS — I110 Hypertensive heart disease with heart failure: Secondary | ICD-10-CM | POA: Diagnosis not present

## 2020-11-05 DIAGNOSIS — C541 Malignant neoplasm of endometrium: Secondary | ICD-10-CM | POA: Diagnosis not present

## 2020-11-06 DIAGNOSIS — I509 Heart failure, unspecified: Secondary | ICD-10-CM | POA: Diagnosis not present

## 2020-11-06 DIAGNOSIS — R197 Diarrhea, unspecified: Secondary | ICD-10-CM | POA: Diagnosis not present

## 2020-11-06 DIAGNOSIS — I110 Hypertensive heart disease with heart failure: Secondary | ICD-10-CM | POA: Diagnosis not present

## 2020-11-06 DIAGNOSIS — R4182 Altered mental status, unspecified: Secondary | ICD-10-CM | POA: Diagnosis not present

## 2020-11-06 DIAGNOSIS — M79605 Pain in left leg: Secondary | ICD-10-CM | POA: Diagnosis not present

## 2020-11-06 DIAGNOSIS — M549 Dorsalgia, unspecified: Secondary | ICD-10-CM | POA: Diagnosis not present

## 2020-11-06 DIAGNOSIS — C541 Malignant neoplasm of endometrium: Secondary | ICD-10-CM | POA: Diagnosis not present

## 2020-11-06 DIAGNOSIS — R079 Chest pain, unspecified: Secondary | ICD-10-CM | POA: Diagnosis not present

## 2020-11-07 DIAGNOSIS — J9601 Acute respiratory failure with hypoxia: Secondary | ICD-10-CM | POA: Diagnosis not present

## 2020-11-07 DIAGNOSIS — C541 Malignant neoplasm of endometrium: Secondary | ICD-10-CM | POA: Diagnosis not present

## 2020-11-07 DIAGNOSIS — R188 Other ascites: Secondary | ICD-10-CM | POA: Diagnosis not present

## 2020-11-07 DIAGNOSIS — I5020 Unspecified systolic (congestive) heart failure: Secondary | ICD-10-CM | POA: Diagnosis not present

## 2020-11-07 DIAGNOSIS — M549 Dorsalgia, unspecified: Secondary | ICD-10-CM | POA: Diagnosis not present

## 2020-11-07 DIAGNOSIS — K6389 Other specified diseases of intestine: Secondary | ICD-10-CM | POA: Diagnosis not present

## 2020-11-07 DIAGNOSIS — R4182 Altered mental status, unspecified: Secondary | ICD-10-CM | POA: Diagnosis not present

## 2020-11-07 DIAGNOSIS — R778 Other specified abnormalities of plasma proteins: Secondary | ICD-10-CM | POA: Diagnosis not present

## 2020-11-07 DIAGNOSIS — M79605 Pain in left leg: Secondary | ICD-10-CM | POA: Diagnosis not present

## 2020-11-07 DIAGNOSIS — I509 Heart failure, unspecified: Secondary | ICD-10-CM | POA: Diagnosis not present

## 2020-11-07 DIAGNOSIS — I110 Hypertensive heart disease with heart failure: Secondary | ICD-10-CM | POA: Diagnosis not present

## 2020-11-07 DIAGNOSIS — E872 Acidosis: Secondary | ICD-10-CM | POA: Diagnosis not present

## 2020-11-08 DIAGNOSIS — M549 Dorsalgia, unspecified: Secondary | ICD-10-CM | POA: Diagnosis not present

## 2020-11-08 DIAGNOSIS — M79605 Pain in left leg: Secondary | ICD-10-CM | POA: Diagnosis not present

## 2020-11-08 DIAGNOSIS — I110 Hypertensive heart disease with heart failure: Secondary | ICD-10-CM | POA: Diagnosis not present

## 2020-11-08 DIAGNOSIS — I5020 Unspecified systolic (congestive) heart failure: Secondary | ICD-10-CM | POA: Diagnosis not present

## 2020-11-08 DIAGNOSIS — C541 Malignant neoplasm of endometrium: Secondary | ICD-10-CM | POA: Diagnosis not present

## 2020-11-08 DIAGNOSIS — E872 Acidosis: Secondary | ICD-10-CM | POA: Diagnosis not present

## 2020-11-08 DIAGNOSIS — R778 Other specified abnormalities of plasma proteins: Secondary | ICD-10-CM | POA: Diagnosis not present

## 2020-11-08 DIAGNOSIS — I509 Heart failure, unspecified: Secondary | ICD-10-CM | POA: Diagnosis not present

## 2020-11-08 DIAGNOSIS — R4182 Altered mental status, unspecified: Secondary | ICD-10-CM | POA: Diagnosis not present

## 2020-11-08 DIAGNOSIS — J9601 Acute respiratory failure with hypoxia: Secondary | ICD-10-CM | POA: Diagnosis not present

## 2020-11-09 DIAGNOSIS — R1310 Dysphagia, unspecified: Secondary | ICD-10-CM | POA: Diagnosis not present

## 2020-11-09 DIAGNOSIS — C541 Malignant neoplasm of endometrium: Secondary | ICD-10-CM | POA: Diagnosis not present

## 2020-11-09 DIAGNOSIS — Z66 Do not resuscitate: Secondary | ICD-10-CM | POA: Diagnosis not present

## 2020-11-09 DIAGNOSIS — N179 Acute kidney failure, unspecified: Secondary | ICD-10-CM | POA: Diagnosis not present

## 2020-12-10 DEATH — deceased

## 2020-12-26 IMAGING — CT CT ABD-PELV W/ CM
2 of 5 series · 16 of 46 positions shown, 18 images · IV contrast (omnipaque)
Comparison: None.

CLINICAL DATA: Nausea and vomiting

EXAM:
CT ABDOMEN AND PELVIS WITH CONTRAST
TECHNIQUE: Multidetector CT imaging of the abdomen and pelvis was performed
using the standard protocol following bolus administration of
intravenous contrast.
CONTRAST:  100mL OMNIPAQUE IOHEXOL 300 MG/ML  SOLN

[Series 3: axial st · axial · 0.89mm/px · z∈[+925,+1350]mm · 13 of 97 slices shown, 15 images]
[im 6/97  soft-tissue]
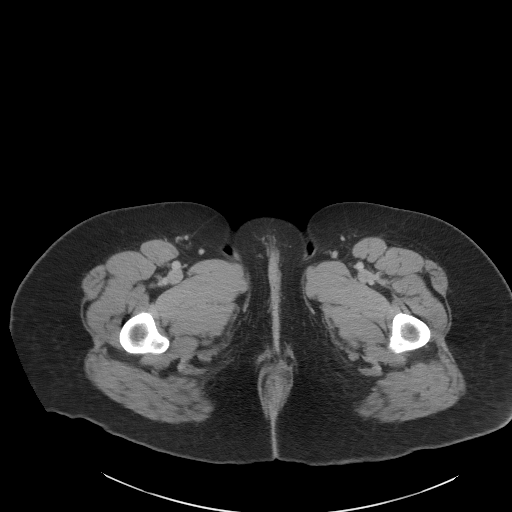
[im 6/97  bone]
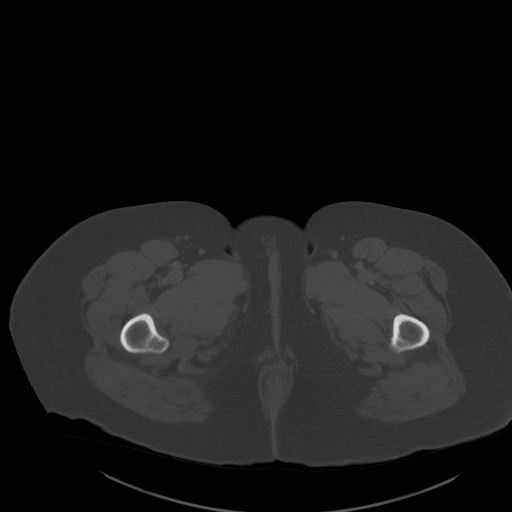
[im 12/97  soft-tissue]
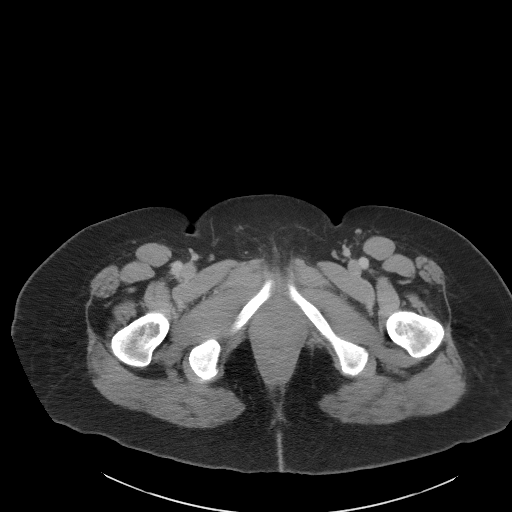
[im 23/97  soft-tissue]
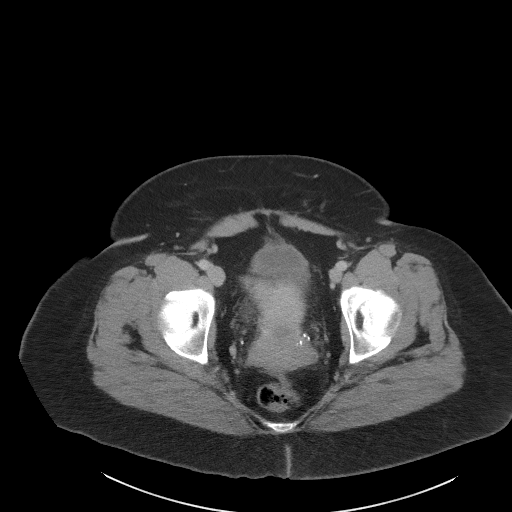
[im 29/97  soft-tissue]
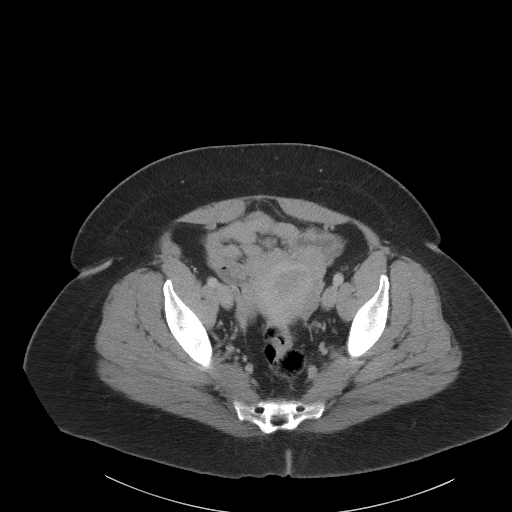
[im 34/97  soft-tissue]
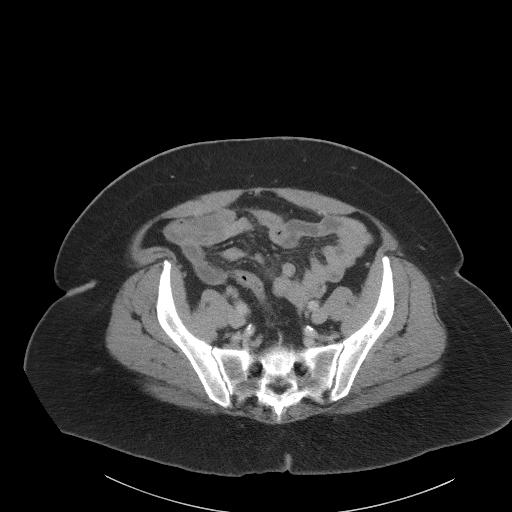
[im 40/97  soft-tissue]
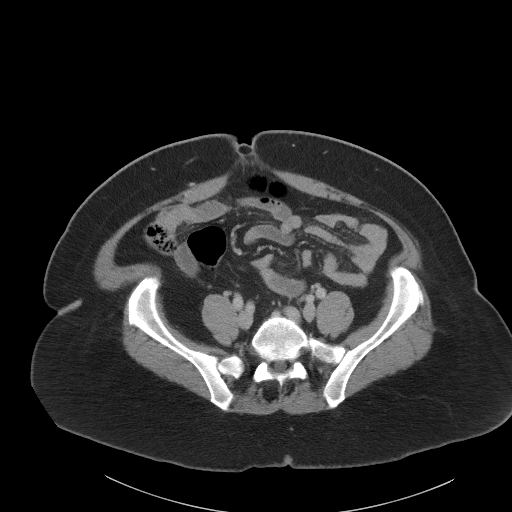
[im 51/97  soft-tissue]
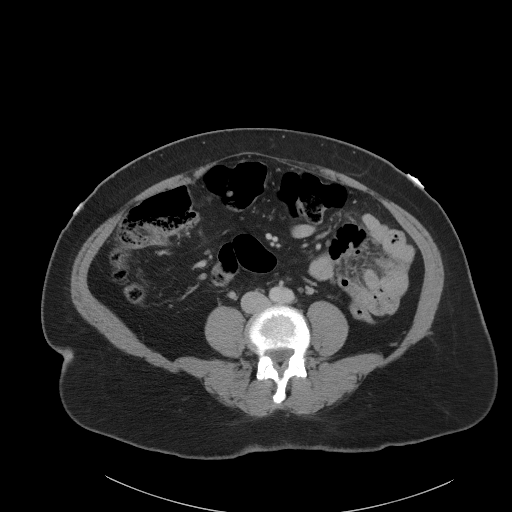
[im 57/97  soft-tissue]
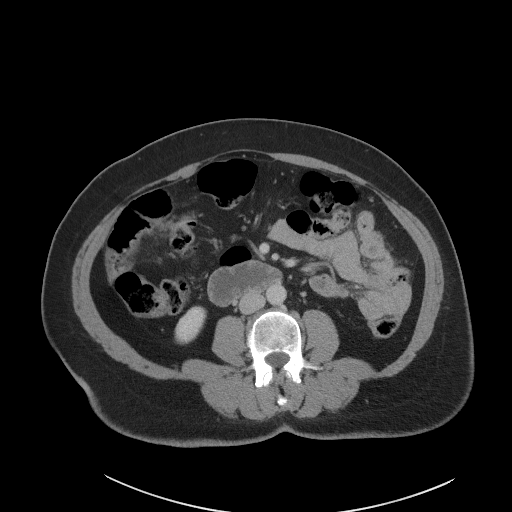
[im 63/97  soft-tissue]
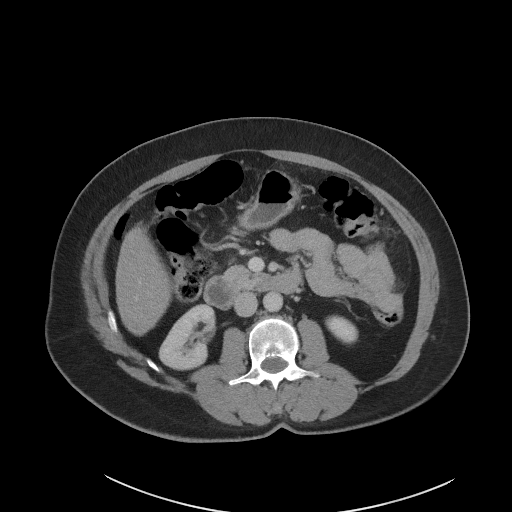
[im 63/97  bone]
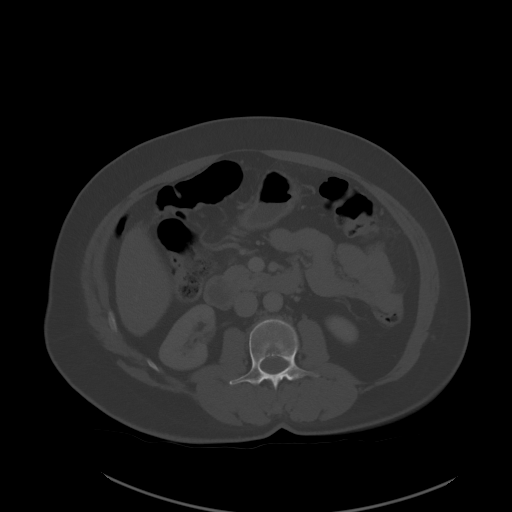
[im 68/97  soft-tissue]
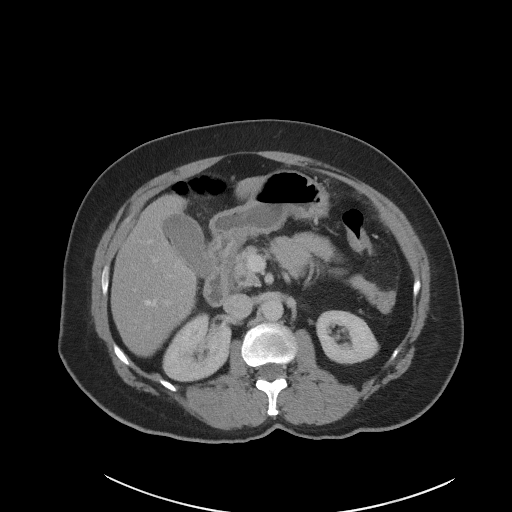
[im 74/97  soft-tissue]
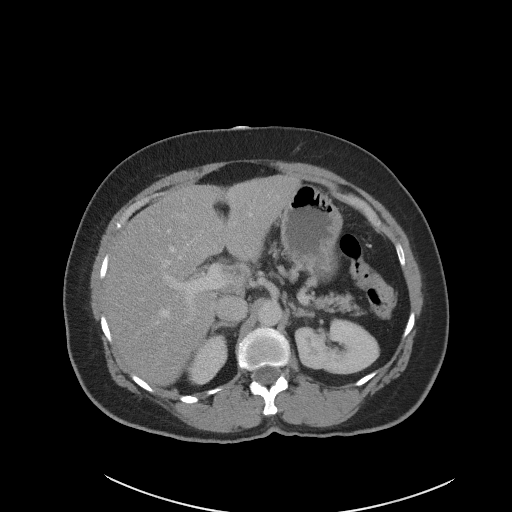
[im 85/97  soft-tissue]
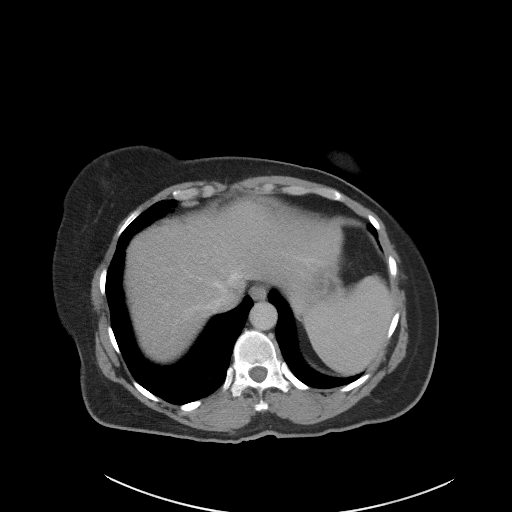
[im 91/97  soft-tissue]
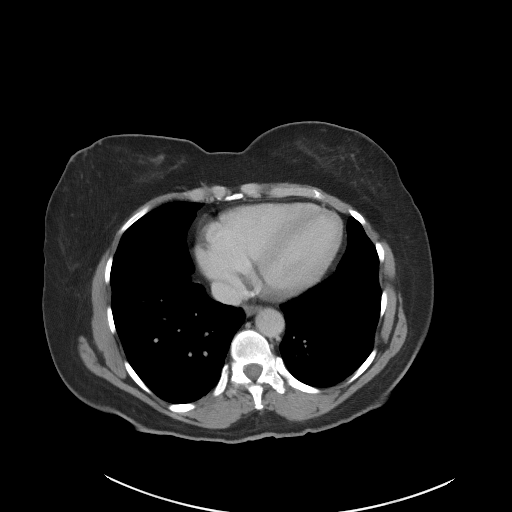

[Series 6: coronal st · coronal · 0.91mm/px · 3 of 110 slices shown]
[im 37/110  soft-tissue]
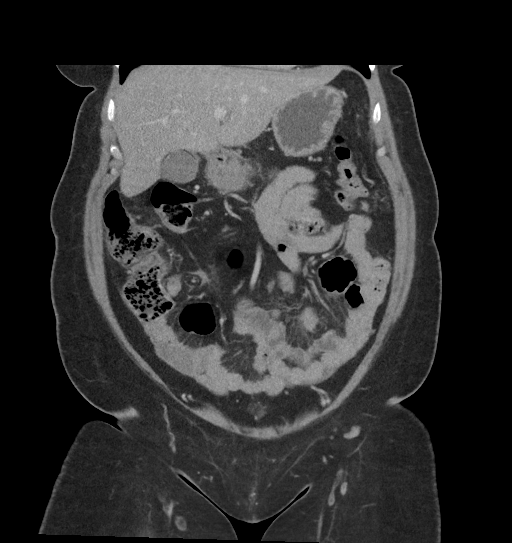
[im 49/110  soft-tissue]
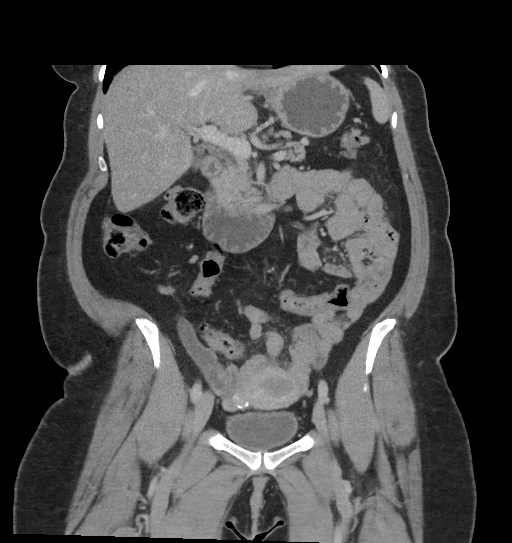
[im 61/110  soft-tissue]
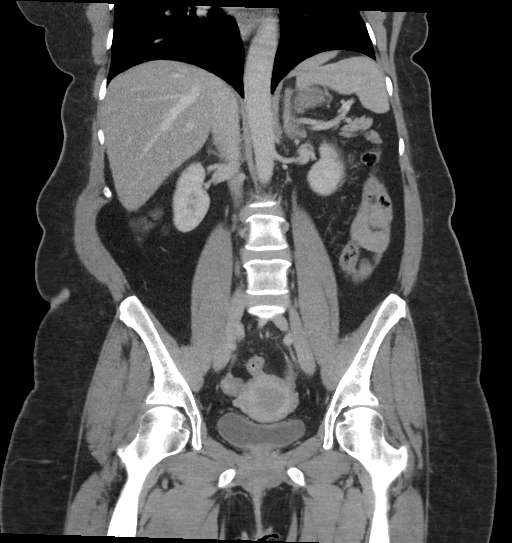

[16 of 46 positions shown; findings below may reference images not displayed]

FINDINGS: Lower chest: Lung bases demonstrate no acute consolidation or
effusion.

Hepatobiliary: Hepatic steatosis. No calcified gallstone or biliary
dilatation.

Pancreas: Unremarkable. No pancreatic ductal dilatation or
surrounding inflammatory changes.

Spleen: Normal in size without focal abnormality.

Adrenals/Urinary Tract: Right adrenal gland is normal. 1.8 cm left
adrenal gland nodule. Kidneys show no hydronephrosis. The bladder is
normal

Stomach/Bowel: The stomach is nonenlarged. Mucosal enhancement, mild
indistinct wall thickening and surrounding inflammatory change at
the duodenal bulb, best seen on coronal images, series 6, image
number 43. No dilated small bowel. Negative appendix.

Vascular/Lymphatic: Mild aortic atherosclerosis without aneurysm. No
suspicious nodes

Reproductive: Abnormal endometrial thickening, measures up to 2.1 cm
on sagittal images. Calcified fundal masses likely fibroids. No
adnexal mass.

Other: Negative for free air or free fluid

Musculoskeletal: No acute or significant osseous findings.
IMPRESSION: 1. Mild indistinct wall thickening and surrounding inflammatory
change at the duodenal bulb suspicious for duodenitis/peptic ulcer
disease. No extraluminal gas to suggest perforation.
2. Otherwise no CT evidence for acute intra-abdominal or pelvic
abnormality
3. Marked abnormal endometrial thickening for which correlation with
pelvic ultrasound is advised. This may be performed on a nonemergent
basis unless otherwise indicated.
